# Patient Record
Sex: Female | Born: 1950 | Race: Black or African American | Hispanic: No | Marital: Married | Smoking: Former smoker
Health system: Southern US, Community
[De-identification: ages and names within clinical notes are randomized; demographics above are authoritative.]

## PROBLEM LIST (undated history)

## (undated) DIAGNOSIS — D649 Anemia, unspecified: Secondary | ICD-10-CM

## (undated) DIAGNOSIS — Z860100 Personal history of colon polyps, unspecified: Secondary | ICD-10-CM

## (undated) DIAGNOSIS — E119 Type 2 diabetes mellitus without complications: Secondary | ICD-10-CM

## (undated) DIAGNOSIS — I10 Essential (primary) hypertension: Secondary | ICD-10-CM

## (undated) DIAGNOSIS — K219 Gastro-esophageal reflux disease without esophagitis: Secondary | ICD-10-CM

## (undated) HISTORY — DX: Personal history of colon polyps, unspecified: Z86.0100

## (undated) HISTORY — DX: Personal history of colonic polyps: Z86.010

## (undated) HISTORY — PX: UPPER GASTROINTESTINAL ENDOSCOPY: SHX188

## (undated) HISTORY — PX: COLONOSCOPY: SHX174

## (undated) HISTORY — PX: HERNIA REPAIR: SHX51

## (undated) HISTORY — DX: Anemia, unspecified: D64.9

## (undated) HISTORY — DX: Gastro-esophageal reflux disease without esophagitis: K21.9

---

## 2016-07-18 MED ORDER — AZITHROMYCIN 250 MG PO TAB
ORAL_TABLET | Freq: Every day | 0 refills | Status: DC
Start: 2016-07-18 — End: 2017-01-21

## 2016-07-18 MED ORDER — FLUTICASONE 50 MCG/ACTUATION NA SPSN
1 | Freq: Two times a day (BID) | NASAL | 3 refills | 60.00000 days | Status: AC
Start: 2016-07-18 — End: ?

## 2016-07-18 MED ORDER — BENZONATATE 200 MG PO CAP
200 mg | ORAL_CAPSULE | ORAL | 0 refills | 9.00000 days | Status: DC | PRN
Start: 2016-07-18 — End: 2017-01-21

## 2016-12-18 ENCOUNTER — Encounter
Admit: 2016-12-18 | Discharge: 2016-12-18 | Payer: MEDICARE | Primary: Student in an Organized Health Care Education/Training Program

## 2016-12-18 NOTE — Telephone Encounter
Pt called stating BP meds not working. When she checks BP can go all the way up to 190/83. This was taken when she was a [facility] with one of their monitors. Asked pt to take again on home monitor while I was on the phone with her was 163/80.  Pt states starts she gets headaches then takes BP and its high. Pt asking advise should she come in for an appointment or just a BP check?  Celene Skeen, LPN

## 2016-12-19 NOTE — Telephone Encounter
Got pt apt on Friday 12/20/16 at 1pm with a NP. Called pt to confirm pt voiced understanding.   Celene Skeen, LPN

## 2017-01-06 ENCOUNTER — Encounter
Admit: 2017-01-06 | Discharge: 2017-01-06 | Payer: MEDICARE | Primary: Student in an Organized Health Care Education/Training Program

## 2017-01-06 DIAGNOSIS — I10 Essential (primary) hypertension: Principal | ICD-10-CM

## 2017-01-06 MED ORDER — LISINOPRIL 10 MG PO TAB
10 mg | ORAL_TABLET | Freq: Every day | ORAL | 0 refills | Status: AC
Start: 2017-01-06 — End: 2017-02-24

## 2017-01-06 MED ORDER — AMLODIPINE 5 MG PO TAB
5 mg | ORAL_TABLET | Freq: Every day | ORAL | 3 refills | Status: AC
Start: 2017-01-06 — End: 2017-02-24

## 2017-01-06 NOTE — Telephone Encounter
Medication(s) refilled per [facility] medication refill standing orders protocol. Ahni Bradwell, LPN

## 2017-01-08 ENCOUNTER — Encounter
Admit: 2017-01-08 | Discharge: 2017-01-08 | Payer: MEDICARE | Primary: Student in an Organized Health Care Education/Training Program

## 2017-01-08 NOTE — Telephone Encounter
Pt calling requesting follow up apt from hospital visit. I provided pt with scheduling number to scheduling apt at early convince .  Celene Skeen, LPN

## 2017-01-21 ENCOUNTER — Ambulatory Visit
Admit: 2017-01-21 | Discharge: 2017-01-21 | Payer: MEDICARE | Primary: Student in an Organized Health Care Education/Training Program

## 2017-01-21 ENCOUNTER — Encounter
Admit: 2017-01-21 | Discharge: 2017-01-21 | Payer: MEDICARE | Primary: Student in an Organized Health Care Education/Training Program

## 2017-01-21 DIAGNOSIS — E119 Type 2 diabetes mellitus without complications: ICD-10-CM

## 2017-01-21 DIAGNOSIS — D573 Sickle-cell trait: ICD-10-CM

## 2017-01-21 DIAGNOSIS — E876 Hypokalemia: Principal | ICD-10-CM

## 2017-01-21 DIAGNOSIS — I10 Essential (primary) hypertension: Principal | ICD-10-CM

## 2017-01-21 DIAGNOSIS — R3121 Asymptomatic microscopic hematuria: ICD-10-CM

## 2017-01-21 DIAGNOSIS — K219 Gastro-esophageal reflux disease without esophagitis: ICD-10-CM

## 2017-01-21 DIAGNOSIS — E7849 Other hyperlipidemia: ICD-10-CM

## 2017-01-21 LAB — CBC AND DIFF
Lab: 192 10*3/uL (ref 150–400)
Lab: 2 % — ABNORMAL LOW (ref >60)
Lab: 36 % (ref 24–44)
Lab: 4.8 M/UL — ABNORMAL HIGH (ref 4.0–5.0)
Lab: 53 % (ref 41–77)
Lab: 7.2 K/UL (ref 4.5–11.0)
Lab: 8 % (ref 4–12)
Lab: 9.1 FL (ref 7–11)

## 2017-01-21 LAB — URINALYSIS DIPSTICK REFLEX TO CULTURE
Lab: NEGATIVE 10*3/uL (ref 3–12)
Lab: NEGATIVE U/L — ABNORMAL LOW (ref 25–110)
Lab: POSITIVE mL/min — AB (ref 1.0–4.8)

## 2017-01-21 LAB — LIPID PROFILE
Lab: 114 mg/dL — ABNORMAL HIGH (ref <150)
Lab: 148 mg/dL (ref <200)
Lab: 23 mg/dL (ref 32.0–36.0)
Lab: 64 mg/dL — ABNORMAL LOW (ref <100)
Lab: 84 mg/dL — ABNORMAL HIGH (ref 11–15)

## 2017-01-21 LAB — URINALYSIS MICROSCOPIC REFLEX TO CULTURE

## 2017-01-21 LAB — COMPREHENSIVE METABOLIC PANEL
Lab: 139 MMOL/L (ref 137–147)
Lab: 3.6 MMOL/L (ref 3.5–5.1)

## 2017-01-21 LAB — HEMOGLOBIN A1C: Lab: 6.6 % — ABNORMAL HIGH (ref >40)

## 2017-01-21 MED ORDER — METFORMIN 1,000 MG PO TAB
1000 mg | ORAL_TABLET | Freq: Two times a day (BID) | ORAL | 3 refills | Status: AC
Start: 2017-01-21 — End: ?

## 2017-01-22 ENCOUNTER — Encounter
Admit: 2017-01-22 | Discharge: 2017-01-22 | Payer: MEDICARE | Primary: Student in an Organized Health Care Education/Training Program

## 2017-01-22 DIAGNOSIS — E119 Type 2 diabetes mellitus without complications: ICD-10-CM

## 2017-01-22 DIAGNOSIS — I10 Essential (primary) hypertension: Principal | ICD-10-CM

## 2017-01-22 NOTE — Progress Notes
I have reviewed the case with Dr. Batool at the time of the visit and agree with the evaluation and plan as documented by the resident.

## 2017-02-14 ENCOUNTER — Encounter
Admit: 2017-02-14 | Discharge: 2017-02-14 | Payer: MEDICARE | Primary: Student in an Organized Health Care Education/Training Program

## 2017-02-14 NOTE — Telephone Encounter
Pt calling seeing if we can fill out paperwork for a physical. Informed pt to drop off paperwork and we can take a look at it or she may need to schedule an appointment.  Celene Skeeniffany Elver Stadler, LPN

## 2017-02-24 ENCOUNTER — Encounter
Admit: 2017-02-24 | Discharge: 2017-02-24 | Payer: MEDICARE | Primary: Student in an Organized Health Care Education/Training Program

## 2017-02-24 DIAGNOSIS — I10 Essential (primary) hypertension: Principal | ICD-10-CM

## 2017-02-24 MED ORDER — AMLODIPINE 5 MG PO TAB
5 mg | ORAL_TABLET | Freq: Every day | ORAL | 1 refills | Status: AC
Start: 2017-02-24 — End: ?

## 2017-02-24 MED ORDER — PANTOPRAZOLE 20 MG PO TBEC
20 mg | ORAL_TABLET | Freq: Every day | ORAL | 1 refills | 90.00000 days | Status: AC
Start: 2017-02-24 — End: 2017-08-21

## 2017-02-24 MED ORDER — LOVASTATIN 20 MG PO TAB
20 mg | ORAL_TABLET | Freq: Every evening | ORAL | 1 refills | Status: AC
Start: 2017-02-24 — End: 2017-08-21

## 2017-02-24 MED ORDER — HYDROCHLOROTHIAZIDE 25 MG PO TAB
25 mg | ORAL_TABLET | Freq: Every morning | ORAL | 1 refills | 28.00000 days | Status: AC
Start: 2017-02-24 — End: 2017-08-21

## 2017-02-24 MED ORDER — LISINOPRIL 10 MG PO TAB
10 mg | ORAL_TABLET | Freq: Every day | ORAL | 1 refills | Status: AC
Start: 2017-02-24 — End: 2017-08-21

## 2017-02-24 NOTE — Telephone Encounter
Comprehensive Metabolic Profile    Lab Results   Component Value Date/Time    NA 139 01/21/2017 02:19 PM    K 3.6 01/21/2017 02:19 PM    CL 102 01/21/2017 02:19 PM    CO2 30 01/21/2017 02:19 PM    GAP 7 01/21/2017 02:19 PM    BUN 20 01/21/2017 02:19 PM    CR 1.09 (H) 01/21/2017 02:19 PM    GLU 104 (H) 01/21/2017 02:19 PM    Lab Results   Component Value Date/Time    CA 10.1 01/21/2017 02:19 PM    ALBUMIN 4.6 01/21/2017 02:19 PM    TOTPROT 7.9 01/21/2017 02:19 PM    ALKPHOS 85 01/21/2017 02:19 PM    AST 18 01/21/2017 02:19 PM    ALT 15 01/21/2017 02:19 PM    TOTBILI 0.4 01/21/2017 02:19 PM    GFR 50 (L) 01/21/2017 02:19 PM    GFRAA >60 01/21/2017 02:19 PM        Pt requesting refill.  LOV:01-21-17  Medication(s) ref[facility] per [facility]. Celene Skeeniffany Shanisha Lech, LPN

## 2017-04-23 ENCOUNTER — Ambulatory Visit
Admit: 2017-04-23 | Discharge: 2017-04-23 | Payer: MEDICARE | Primary: Student in an Organized Health Care Education/Training Program

## 2017-04-23 ENCOUNTER — Encounter
Admit: 2017-04-23 | Discharge: 2017-04-23 | Payer: MEDICARE | Primary: Student in an Organized Health Care Education/Training Program

## 2017-04-23 DIAGNOSIS — E119 Type 2 diabetes mellitus without complications: ICD-10-CM

## 2017-04-23 DIAGNOSIS — K219 Gastro-esophageal reflux disease without esophagitis: ICD-10-CM

## 2017-04-23 DIAGNOSIS — I10 Essential (primary) hypertension: Principal | ICD-10-CM

## 2017-04-23 DIAGNOSIS — R252 Cramp and spasm: ICD-10-CM

## 2017-04-23 DIAGNOSIS — Z0000 Encounter for general adult medical examination without abnormal findings: ICD-10-CM

## 2017-04-23 DIAGNOSIS — E7849 Other hyperlipidemia: ICD-10-CM

## 2017-06-19 ENCOUNTER — Encounter
Admit: 2017-06-19 | Discharge: 2017-06-19 | Payer: MEDICARE | Primary: Student in an Organized Health Care Education/Training Program

## 2017-08-20 ENCOUNTER — Encounter
Admit: 2017-08-20 | Discharge: 2017-08-20 | Payer: MEDICARE | Primary: Student in an Organized Health Care Education/Training Program

## 2017-08-20 DIAGNOSIS — I10 Essential (primary) hypertension: Principal | ICD-10-CM

## 2017-08-20 MED ORDER — PANTOPRAZOLE 20 MG PO TBEC
20 mg | ORAL_TABLET | Freq: Every day | ORAL | 3 refills | 90.00000 days | Status: AC
Start: 2017-08-20 — End: ?

## 2017-08-20 MED ORDER — HYDROCHLOROTHIAZIDE 25 MG PO TAB
25 mg | ORAL_TABLET | Freq: Every morning | ORAL | 3 refills | 28.00000 days | Status: AC
Start: 2017-08-20 — End: ?

## 2017-08-20 MED ORDER — LISINOPRIL 10 MG PO TAB
10 mg | ORAL_TABLET | Freq: Every day | ORAL | 3 refills | Status: AC
Start: 2017-08-20 — End: ?

## 2017-08-20 MED ORDER — LOVASTATIN 20 MG PO TAB
20 mg | ORAL_TABLET | Freq: Every evening | ORAL | 3 refills | Status: AC
Start: 2017-08-20 — End: ?

## 2017-12-26 ENCOUNTER — Encounter
Admit: 2017-12-26 | Discharge: 2017-12-26 | Payer: MEDICARE | Primary: Student in an Organized Health Care Education/Training Program

## 2018-05-18 ENCOUNTER — Encounter
Admit: 2018-05-18 | Discharge: 2018-05-18 | Payer: MEDICARE | Primary: Student in an Organized Health Care Education/Training Program

## 2018-09-01 ENCOUNTER — Encounter: Admit: 2018-09-01 | Discharge: 2018-09-01 | Primary: Student in an Organized Health Care Education/Training Program

## 2018-09-01 DIAGNOSIS — I10 Essential (primary) hypertension: Secondary | ICD-10-CM

## 2018-09-01 MED ORDER — LISINOPRIL 10 MG PO TAB
ORAL_TABLET | Freq: Every day | 8 refills
Start: 2018-09-01 — End: ?

## 2018-09-01 MED ORDER — PANTOPRAZOLE 20 MG PO TBEC
ORAL_TABLET | Freq: Every day | 5 refills
Start: 2018-09-01 — End: ?

## 2018-12-10 ENCOUNTER — Encounter
Admit: 2018-12-10 | Discharge: 2018-12-10 | Payer: MEDICARE | Primary: Student in an Organized Health Care Education/Training Program

## 2020-05-11 DIAGNOSIS — D649 Anemia, unspecified: Secondary | ICD-10-CM | POA: Diagnosis not present

## 2020-05-11 DIAGNOSIS — R946 Abnormal results of thyroid function studies: Secondary | ICD-10-CM | POA: Diagnosis not present

## 2020-05-11 DIAGNOSIS — K219 Gastro-esophageal reflux disease without esophagitis: Secondary | ICD-10-CM | POA: Diagnosis not present

## 2020-05-11 DIAGNOSIS — I10 Essential (primary) hypertension: Secondary | ICD-10-CM | POA: Diagnosis not present

## 2020-05-11 DIAGNOSIS — R7303 Prediabetes: Secondary | ICD-10-CM | POA: Diagnosis not present

## 2020-05-11 DIAGNOSIS — E7800 Pure hypercholesterolemia, unspecified: Secondary | ICD-10-CM | POA: Diagnosis not present

## 2020-07-28 ENCOUNTER — Other Ambulatory Visit: Payer: Self-pay

## 2020-07-28 ENCOUNTER — Encounter (HOSPITAL_COMMUNITY): Payer: Self-pay | Admitting: Emergency Medicine

## 2020-07-28 ENCOUNTER — Ambulatory Visit (HOSPITAL_COMMUNITY)
Admission: EM | Admit: 2020-07-28 | Discharge: 2020-07-28 | Disposition: A | Discharge status: Home / Self Care | Payer: Medicare HMO | Attending: Family Medicine | Admitting: Family Medicine

## 2020-07-28 DIAGNOSIS — J029 Acute pharyngitis, unspecified: Secondary | ICD-10-CM | POA: Diagnosis not present

## 2020-07-28 DIAGNOSIS — Z79899 Other long term (current) drug therapy: Secondary | ICD-10-CM | POA: Diagnosis not present

## 2020-07-28 DIAGNOSIS — J069 Acute upper respiratory infection, unspecified: Secondary | ICD-10-CM | POA: Diagnosis not present

## 2020-07-28 DIAGNOSIS — J302 Other seasonal allergic rhinitis: Secondary | ICD-10-CM | POA: Insufficient documentation

## 2020-07-28 DIAGNOSIS — Z20822 Contact with and (suspected) exposure to covid-19: Secondary | ICD-10-CM | POA: Diagnosis not present

## 2020-07-28 HISTORY — DX: Essential (primary) hypertension: I10

## 2020-07-28 HISTORY — DX: Type 2 diabetes mellitus without complications: E11.9

## 2020-07-28 MED ORDER — PROMETHAZINE-DM 6.25-15 MG/5ML PO SYRP: 5.0000 mL | ORAL_SOLUTION | Freq: Four times a day (QID) | ORAL | 0 refills | Status: DC | PRN

## 2020-07-28 MED ORDER — CETIRIZINE HCL 10 MG PO TABS: 10.0000 mg | ORAL_TABLET | Freq: Every day | ORAL | 2 refills | Status: DC

## 2020-07-28 NOTE — ED Triage Notes (Signed)
Pt presents with sough and sore throat xs 3 day.

## 2020-07-28 NOTE — ED Provider Notes (Signed)
[facility]    CSN: [id number] Arrival date & time: 07/28/20  1612      History   Chief Complaint Chief Complaint  Patient presents with  . Sore Throat  . Cough    HPI Shelley Bryant is a 70 y.o. female.   Patient here today with 3-day history of cough, sore throat, congestion.  The cough is at times productive.  Denies fever, chills, chest pain, shortness of breath, abdominal pain, nausea vomiting diarrhea.  No known sick contacts.  Taking throat lozenges so far with minimal relief.  Does have a history of seasonal allergies but she does not take anything for this.     Past Medical History:  Diagnosis Date  . Diabetes mellitus without complication ([place])   . Hypertension     There are no problems to display for this patient.   Past Surgical History:  Procedure Laterality Date  . HERNIA REPAIR      OB History   No obstetric history on file.      Home Medications    Prior to Admission medications   Medication Sig Start Date End Date Taking? Authorizing Provider  amLODipine (NORVASC) 5 MG tablet 1 tablet 02/24/17  Yes [provider]  cetirizine (ZYRTEC ALLERGY) 10 MG tablet Take 1 tablet (10 mg total) by mouth daily. 07/28/20  Yes Volney American, PA-C  lisinopril (ZESTRIL) 40 MG tablet 1 tablet 07/01/18  Yes [provider]  lovastatin (MEVACOR) 20 MG tablet 1 tablet with the evening meal 08/20/17  Yes [provider]  metFORMIN (GLUCOPHAGE) 500 MG tablet 1 tablet with a meal 01/03/20  Yes [provider]  pantoprazole (PROTONIX) 20 MG tablet 1 tablet 08/20/17  Yes [provider]  promethazine-dextromethorphan (PROMETHAZINE-DM) 6.25-15 MG/5ML syrup Take 5 mLs by mouth 4 (four) times daily as needed for cough. 07/28/20  Yes Volney American, PA-C  hydrochlorothiazide (HYDRODIURIL) 25 MG tablet Take 1 tablet by mouth daily. 03/29/20   [provider]    Family History Family History   Problem Relation Age of Onset  . Heart disease Mother   . Cirrhosis Father     Social History Social History   Tobacco Use  . Smoking status: Never Smoker  . Smokeless tobacco: Never Used  Substance Use Topics  . Alcohol use: Yes  . Drug use: Never     Allergies   Patient has no known allergies.   Review of Systems Review of Systems HPI  Physical Exam Triage Vital Signs ED Triage Vitals  Enc Vitals Group     BP 07/28/20 1713 136/78     Pulse Rate 07/28/20 1713 (!) 53     Resp 07/28/20 1713 17     Temp 07/28/20 1713 98.3 F (36.8 C)     Temp Source 07/28/20 1713 Oral     SpO2 07/28/20 1713 96 %     Weight --      Height --      Head Circumference --      Peak Flow --      Pain Score 07/28/20 1710 0     Pain Loc --      Pain Edu? --      Excl. in [place]? --    No data found.  Updated Vital Signs BP 136/78 (BP Location: Right Arm)   Pulse (!) 53   Temp 98.3 F (36.8 C) (Oral)   Resp 17   SpO2 96%   Visual Acuity Right  Eye Distance:   Left Eye Distance:   Bilateral Distance:    Right Eye Near:   Left Eye Near:    Bilateral Near:     Physical Exam Vitals and nursing note reviewed.  Constitutional:      Appearance: Normal appearance. She is not ill-appearing.  HENT:     Head: Atraumatic.     Right Ear: Tympanic membrane normal.     Left Ear: Tympanic membrane normal.     Nose: Rhinorrhea present.     Mouth/Throat:     Mouth: Mucous membranes are moist.     Pharynx: Oropharynx is clear. Posterior oropharyngeal erythema present.  Eyes:     Extraocular Movements: Extraocular movements intact.     Conjunctiva/sclera: Conjunctivae normal.  Cardiovascular:     Rate and Rhythm: Normal rate and regular rhythm.     Heart sounds: Normal heart sounds.  Pulmonary:     Effort: Pulmonary effort is normal. No respiratory distress.     Breath sounds: Normal breath sounds. No wheezing or rales.  Musculoskeletal:        General: Normal range of motion.      Cervical back: Normal range of motion and neck supple.  Skin:    General: Skin is warm and dry.  Neurological:     Mental Status: She is alert and oriented to person, place, and time.  Psychiatric:        Mood and Affect: Mood normal.        Thought Content: Thought content normal.        Judgment: Judgment normal.      UC Treatments / Results  Labs (all labs ordered are listed, but only abnormal results are displayed) Labs Reviewed  SARS CORONAVIRUS 2 (TAT 6-24 HRS)    EKG   Radiology No results found.  Procedures Procedures (including critical care time)  Medications Ordered in UC Medications - No data to display  Initial Impression / Assessment and Plan / UC Course  I have reviewed the triage vital signs and the nursing notes.  Pertinent labs & imaging results that were available during my care of the patient were reviewed by me and considered in my medical decision making (see chart for details).     Viral versus allergic.  Given her history of seasonal allergies, sent Zyrtec for her to start taking daily to cover for allergic symptoms.  COVID PCR pending, discussed safe over-the-counter medications for symptomatic management and supportive home care.  Isolation protocol reviewed, return cautions given.  Final Clinical Impressions(s) / UC Diagnoses   Final diagnoses:  Viral URI with cough  Seasonal allergies   Discharge Instructions   None    ED Prescriptions    Medication Sig Dispense Auth. Provider   promethazine-dextromethorphan (PROMETHAZINE-DM) 6.25-15 MG/5ML syrup Take 5 mLs by mouth 4 (four) times daily as needed for cough. 100 mL Volney American, PA-C   cetirizine (ZYRTEC ALLERGY) 10 MG tablet Take 1 tablet (10 mg total) by mouth daily. 30 tablet Volney American, [place]     PDMP not reviewed this encounter.   Volney American, [place] 07/28/20 1740

## 2020-07-29 LAB — SARS CORONAVIRUS 2 (TAT 6-24 HRS): SARS Coronavirus 2: NEGATIVE

## 2020-09-20 DIAGNOSIS — E119 Type 2 diabetes mellitus without complications: Secondary | ICD-10-CM | POA: Diagnosis not present

## 2020-10-22 ENCOUNTER — Other Ambulatory Visit: Payer: Self-pay | Admitting: Family Medicine

## 2020-10-22 NOTE — Telephone Encounter (Signed)
Pt no longer under prescriber care

## 2020-11-05 ENCOUNTER — Encounter (HOSPITAL_COMMUNITY): Payer: Self-pay

## 2020-11-05 ENCOUNTER — Emergency Department (HOSPITAL_COMMUNITY): Payer: 59

## 2020-11-05 ENCOUNTER — Other Ambulatory Visit: Payer: Self-pay

## 2020-11-05 ENCOUNTER — Emergency Department (HOSPITAL_COMMUNITY)
Admission: EM | Admit: 2020-11-05 | Discharge: 2020-11-05 | Disposition: A | Discharge status: Home / Self Care | Payer: 59 | Attending: Emergency Medicine | Admitting: Emergency Medicine

## 2020-11-05 DIAGNOSIS — R0602 Shortness of breath: Secondary | ICD-10-CM | POA: Insufficient documentation

## 2020-11-05 DIAGNOSIS — R519 Headache, unspecified: Secondary | ICD-10-CM | POA: Insufficient documentation

## 2020-11-05 DIAGNOSIS — Z5321 Procedure and treatment not carried out due to patient leaving prior to being seen by health care provider: Secondary | ICD-10-CM | POA: Diagnosis not present

## 2020-11-05 LAB — COMPREHENSIVE METABOLIC PANEL
ALT: 18 U/L (ref 0–44)
AST: 21 U/L (ref 15–41)
Albumin: 3.7 g/dL (ref 3.5–5.0)
Alkaline Phosphatase: 65 U/L (ref 38–126)
Anion gap: 7 (ref 5–15)
BUN: 13 mg/dL (ref 8–23)
CO2: 25 mmol/L (ref 22–32)
Calcium: 9.4 mg/dL (ref 8.9–10.3)
Chloride: 107 mmol/L (ref 98–111)
Creatinine, Ser: 1.06 mg/dL — ABNORMAL HIGH (ref 0.44–1.00)
GFR, Estimated: 57 mL/min — ABNORMAL LOW (ref >60)
Glucose, Bld: 128 mg/dL — ABNORMAL HIGH (ref 70–99)
Potassium: 3.7 mmol/L (ref 3.5–5.1)
Sodium: 139 mmol/L (ref 135–145)
Total Bilirubin: 0.4 mg/dL (ref 0.3–1.2)
Total Protein: 6.6 g/dL (ref 6.5–8.1)

## 2020-11-05 LAB — CBC WITH DIFFERENTIAL/PLATELET
Abs Immature Granulocytes: 0.02 10*3/uL (ref 0.00–0.07)
Basophils Absolute: 0 10*3/uL (ref 0.0–0.1)
Basophils Relative: 1 %
Eosinophils Absolute: 0.1 10*3/uL (ref 0.0–0.5)
Eosinophils Relative: 2 %
HCT: 32.8 % — ABNORMAL LOW (ref 36.0–46.0)
Hemoglobin: 11 g/dL — ABNORMAL LOW (ref 12.0–15.0)
Immature Granulocytes: 0 %
Lymphocytes Relative: 32 %
Lymphs Abs: 1.7 10*3/uL (ref 0.7–4.0)
MCH: 25.5 pg — ABNORMAL LOW (ref 26.0–34.0)
MCHC: 33.5 g/dL (ref 30.0–36.0)
MCV: 76.1 fL — ABNORMAL LOW (ref 80.0–100.0)
Monocytes Absolute: 0.4 10*3/uL (ref 0.1–1.0)
Monocytes Relative: 7 %
Neutro Abs: 3.1 10*3/uL (ref 1.7–7.7)
Neutrophils Relative %: 58 %
Platelets: 183 10*3/uL (ref 150–400)
RBC: 4.31 MIL/uL (ref 3.87–5.11)
RDW: 14.8 % (ref 11.5–15.5)
WBC: 5.3 10*3/uL (ref 4.0–10.5)
nRBC: 0 % (ref 0.0–0.2)

## 2020-11-05 LAB — LIPASE, BLOOD: Lipase: 36 U/L (ref 11–51)

## 2020-11-05 LAB — MAGNESIUM: Magnesium: 2 mg/dL (ref 1.7–2.4)

## 2020-11-05 LAB — TROPONIN I (HIGH SENSITIVITY)
Troponin I (High Sensitivity): 4 ng/L (ref <18)
Troponin I (High Sensitivity): 4 ng/L (ref <18)

## 2020-11-05 LAB — BRAIN NATRIURETIC PEPTIDE: B Natriuretic Peptide: 30.6 pg/mL (ref 0.0–100.0)

## 2020-11-05 NOTE — ED Notes (Signed)
Pt stated was not waiting any longer.

## 2020-11-05 NOTE — ED Provider Notes (Signed)
Emergency Medicine Provider Triage Evaluation Note  Shelley Bryant , a 70 y.o. female  was evaluated in triage.  Pt complains of multiple complaints. Patient with epigastric pain, feels like needs to burp. Also with Chest pain, central, non radiating. No back pain, left arm, jaw pain. Non exertional in nature. Having heart palpitations. Feels like BLE swelling at the feet. No hx of HF. Occasional cough. No PND. No sick contacts. Feels like throat swells when she eats. No throat swelling currently  Review of Systems  Positive: HA, cough, CP, SOB, nausea Negative: Back pain, fever, emesis, dysuria, diarrhea  Physical Exam  BP (!) 151/84 (BP Location: Right Arm)   Pulse 68   Temp 99.2 F (37.3 C) (Oral)   Resp 18   Ht '5\' 7"'$  (1.702 m)   Wt 103 kg   SpO2 100%   BMI 35.55 kg/m  Gen:   Awake, no distress   Cardiac: Clear Resp:  Normal effort  MSK:   Moves extremities without difficulty  Abd:  Soft Neuro:  Cn 2-12 intact. Equal strength Other:    Medical Decision Making  Medically screening exam initiated at 4:11 PM.  Appropriate orders placed.  BAO MOOREFIELD was informed that the remainder of the evaluation will be completed by another provider, this initial triage assessment does not replace that evaluation, and the importance of remaining in the ED until their evaluation is complete.  CP, HA, SOB, epigastric pain  Work up started.   Nettie Elm, PA-C 11/05/20 1614    Wyvonnia Dusky, MD 11/06/20 [phone]

## 2020-11-05 NOTE — ED Triage Notes (Signed)
Patient arrives with complaints of shortness of breath x2 days, feeling like her heart is fluttering, headaches, swollen glands in her neck.

## 2020-11-14 DIAGNOSIS — R946 Abnormal results of thyroid function studies: Secondary | ICD-10-CM | POA: Diagnosis not present

## 2020-11-14 DIAGNOSIS — I10 Essential (primary) hypertension: Secondary | ICD-10-CM | POA: Diagnosis not present

## 2020-11-14 DIAGNOSIS — E7800 Pure hypercholesterolemia, unspecified: Secondary | ICD-10-CM | POA: Diagnosis not present

## 2020-11-14 DIAGNOSIS — R7303 Prediabetes: Secondary | ICD-10-CM | POA: Diagnosis not present

## 2020-12-12 DIAGNOSIS — J0110 Acute frontal sinusitis, unspecified: Secondary | ICD-10-CM | POA: Diagnosis not present

## 2020-12-12 DIAGNOSIS — R0981 Nasal congestion: Secondary | ICD-10-CM | POA: Diagnosis not present

## 2020-12-12 DIAGNOSIS — I10 Essential (primary) hypertension: Secondary | ICD-10-CM | POA: Diagnosis not present

## 2020-12-12 DIAGNOSIS — R599 Enlarged lymph nodes, unspecified: Secondary | ICD-10-CM | POA: Diagnosis not present

## 2020-12-28 DIAGNOSIS — R946 Abnormal results of thyroid function studies: Secondary | ICD-10-CM | POA: Diagnosis not present

## 2020-12-28 DIAGNOSIS — E7800 Pure hypercholesterolemia, unspecified: Secondary | ICD-10-CM | POA: Diagnosis not present

## 2020-12-28 DIAGNOSIS — R7303 Prediabetes: Secondary | ICD-10-CM | POA: Diagnosis not present

## 2020-12-28 DIAGNOSIS — Z23 Encounter for immunization: Secondary | ICD-10-CM | POA: Diagnosis not present

## 2020-12-28 DIAGNOSIS — I10 Essential (primary) hypertension: Secondary | ICD-10-CM | POA: Diagnosis not present

## 2020-12-28 DIAGNOSIS — K219 Gastro-esophageal reflux disease without esophagitis: Secondary | ICD-10-CM | POA: Diagnosis not present

## 2021-01-04 ENCOUNTER — Encounter: Payer: Self-pay | Admitting: Gastroenterology

## 2021-01-16 DIAGNOSIS — Z206 Contact with and (suspected) exposure to human immunodeficiency virus [HIV]: Secondary | ICD-10-CM | POA: Diagnosis not present

## 2021-01-16 DIAGNOSIS — Z118 Encounter for screening for other infectious and parasitic diseases: Secondary | ICD-10-CM | POA: Diagnosis not present

## 2021-01-16 DIAGNOSIS — Z113 Encounter for screening for infections with a predominantly sexual mode of transmission: Secondary | ICD-10-CM | POA: Diagnosis not present

## 2021-01-17 ENCOUNTER — Other Ambulatory Visit (INDEPENDENT_AMBULATORY_CARE_PROVIDER_SITE_OTHER): Payer: Medicare HMO

## 2021-01-17 ENCOUNTER — Ambulatory Visit (INDEPENDENT_AMBULATORY_CARE_PROVIDER_SITE_OTHER): Payer: Medicare HMO | Admitting: Gastroenterology

## 2021-01-17 ENCOUNTER — Encounter: Payer: Self-pay | Admitting: Gastroenterology

## 2021-01-17 VITALS — BP 126/68 | HR 75 | Ht 67.0 in | Wt 226.6 lb

## 2021-01-17 DIAGNOSIS — R079 Chest pain, unspecified: Secondary | ICD-10-CM | POA: Diagnosis not present

## 2021-01-17 DIAGNOSIS — D508 Other iron deficiency anemias: Secondary | ICD-10-CM

## 2021-01-17 DIAGNOSIS — K219 Gastro-esophageal reflux disease without esophagitis: Secondary | ICD-10-CM

## 2021-01-17 DIAGNOSIS — R0602 Shortness of breath: Secondary | ICD-10-CM

## 2021-01-17 DIAGNOSIS — R1310 Dysphagia, unspecified: Secondary | ICD-10-CM

## 2021-01-17 DIAGNOSIS — R002 Palpitations: Secondary | ICD-10-CM

## 2021-01-17 LAB — BASIC METABOLIC PANEL
BUN: 15 mg/dL (ref 6–23)
CO2: 29 mEq/L (ref 19–32)
Calcium: 9.9 mg/dL (ref 8.4–10.5)
Chloride: 103 mEq/L (ref 96–112)
Creatinine, Ser: 1.29 mg/dL — ABNORMAL HIGH (ref 0.40–1.20)
GFR: 41.94 mL/min — ABNORMAL LOW (ref >60.00)
Glucose, Bld: 136 mg/dL — ABNORMAL HIGH (ref 70–99)
Potassium: 3.8 mEq/L (ref 3.5–5.1)
Sodium: 139 mEq/L (ref 135–145)

## 2021-01-17 LAB — CBC WITH DIFFERENTIAL/PLATELET
Basophils Absolute: 0 10*3/uL (ref 0.0–0.1)
Basophils Relative: 0.9 % (ref 0.0–3.0)
Eosinophils Absolute: 0.1 10*3/uL (ref 0.0–0.7)
Eosinophils Relative: 2.2 % (ref 0.0–5.0)
HCT: 36.8 % (ref 36.0–46.0)
Hemoglobin: 12.6 g/dL (ref 12.0–15.0)
Lymphocytes Relative: 30.1 % (ref 12.0–46.0)
Lymphs Abs: 1.7 10*3/uL (ref 0.7–4.0)
MCHC: 34.3 g/dL (ref 30.0–36.0)
MCV: 74.6 fl — ABNORMAL LOW (ref 78.0–100.0)
Monocytes Absolute: 0.4 10*3/uL (ref 0.1–1.0)
Monocytes Relative: 7.2 % (ref 3.0–12.0)
Neutro Abs: 3.4 10*3/uL (ref 1.4–7.7)
Neutrophils Relative %: 59.6 % (ref 43.0–77.0)
Platelets: 199 10*3/uL (ref 150.0–400.0)
RBC: 4.93 Mil/uL (ref 3.87–5.11)
RDW: 15.9 % — ABNORMAL HIGH (ref 11.5–15.5)
WBC: 5.7 10*3/uL (ref 4.0–10.5)

## 2021-01-17 LAB — IBC + FERRITIN
Ferritin: 225.8 ng/mL (ref 10.0–291.0)
Iron: 48 ug/dL (ref 42–145)
Saturation Ratios: 14.6 % — ABNORMAL LOW (ref 20.0–50.0)
TIBC: 329 ug/dL (ref 250.0–450.0)
Transferrin: 235 mg/dL (ref 212.0–360.0)

## 2021-01-17 LAB — VITAMIN B12: Vitamin B-12: 811 pg/mL (ref 211–911)

## 2021-01-17 LAB — FOLATE: Folate: 14.8 ng/mL (ref >5.9)

## 2021-01-17 NOTE — Patient Instructions (Addendum)
Your provider has requested that you go to the basement level for lab work before leaving today. Press "B" on the elevator. The lab is located at the first door on the left as you exit the elevator.   You have been scheduled for an endoscopy and colonoscopy. Please follow the written instructions given to you at your visit today. Please pick up your prep supplies at the pharmacy within the next 1-3 days. If you use inhalers (even only as needed), please bring them with you on the day of your procedure.   STOP taking Protonix  We will send Lansoprazole to your pharmacy, you will take that twice a day before breakfast and dinner   We will refer you to cardiology and contact you with that appointment or they may call you directly to schedule   Due to recent changes in healthcare laws, you may see the results of your imaging and laboratory studies on MyChart before your provider has had a chance to review them.  We understand that in some cases there may be results that are confusing or concerning to you. Not all laboratory results come back in the same time frame and the provider may be waiting for multiple results in order to interpret others.  Please give [place] 48 hours in order for your provider to thoroughly review all the results before contacting the office for clarification of your results.    If you are age 63 or older, your body mass index should be between 23-30. Your Body mass index is 35.49 kg/m. If this is out of the aforementioned range listed, please consider follow up with your Primary Care Provider.  If you are age 27 or younger, your body mass index should be between 19-25. Your Body mass index is 35.49 kg/m. If this is out of the aformentioned range listed, please consider follow up with your Primary Care Provider.   ________________________________________________________  The [facility] providers would like to encourage you to use [facility] to communicate with providers for  non-urgent requests or questions.  Due to long hold times on the telephone, sending your provider a message by [facility] may be a faster and more efficient way to get a response.  Please allow 48 business hours for a response.  Please remember that this is for non-urgent requests.  _______________________________________________________    I appreciate the  opportunity to care for you  Thank You   Harl Bowie , MD

## 2021-01-17 NOTE — Progress Notes (Signed)
Shelley Bryant    [id number]    November 18, 1950  Primary Care Physician:Collins, Hinton Dyer, DO  Referring Physician: Janie Morning, [address] [place],  [place] [address]   Chief complaint:  GERD, Bloated  HPI:  70 year old very pleasant female here for new patient with complaints of difficulty swallowing, epigastric pain and globus sensation. She feels occasionally her throat feels swollen and she is choking when she tries to swallow anything.  She also has intermittent increased mucus in her throat.  She has been experiencing intermittent chest pain, flutter/palpitations in her chest and also shortness of breath.  She had ankle swelling in both her legs few weeks ago but it has improved in the past few days.  Denies any vomiting, rectal bleeding, blood in stool or melena. No family history of GI malignancy.  Last colonoscopy was about 7 years ago in [place], report is not available during this visit to review  She was recently seen in the ER with complaints of epigastric pain, chest pain, shortness of breath, heart palpitations and lower extremity swelling.  Patient left ER after waiting for long period before she could be completely evaluated in August 2022  Chest x-ray November 05, 2020: No evidence of pneumonia or pulmonary edema EKG October 28, 2020: Normal sinus rhythm, no acute abnormality On review of labs, had normal troponin and BNP.  Mild anemia with hemoglobin 11, low MCV  Outpatient Encounter Medications as of 01/17/2021  Medication Sig   amLODipine (NORVASC) 5 MG tablet 1 tablet   cetirizine (ZYRTEC ALLERGY) 10 MG tablet Take 1 tablet (10 mg total) by mouth daily.   hydrochlorothiazide (HYDRODIURIL) 25 MG tablet Take 1 tablet by mouth daily.   lisinopril (ZESTRIL) 40 MG tablet 1 tablet   lovastatin (MEVACOR) 20 MG tablet 1 tablet with the evening meal   metFORMIN (GLUCOPHAGE) 500 MG tablet 1 tablet with a meal   pantoprazole (PROTONIX) 20  MG tablet 1 tablet   promethazine-dextromethorphan (PROMETHAZINE-DM) 6.25-15 MG/5ML syrup Take 5 mLs by mouth 4 (four) times daily as needed for cough.   No facility-administered encounter medications on file as of 01/17/2021.    Allergies as of 01/17/2021   (No Known Allergies)    Past Medical History:  Diagnosis Date   Diabetes mellitus without complication ([place])    Hypertension     Past Surgical History:  Procedure Laterality Date   HERNIA REPAIR      Family History  Problem Relation Age of Onset   Heart disease Mother    Cirrhosis Father     Social History   Socioeconomic History   Marital status: Married    Spouse name: Not on file   Number of children: Not on file   Years of education: Not on file   Highest education level: Not on file  Occupational History   Not on file  Tobacco Use   Smoking status: Never   Smokeless tobacco: Never  Substance and Sexual Activity   Alcohol use: Yes   Drug use: Never   Sexual activity: Not on file  Other Topics Concern   Not on file  Social History Narrative   Not on file   Social Determinants of Health   Financial Resource Strain: Not on file  Food Insecurity: Not on file  Transportation Needs: Not on file  Physical Activity: Not on file  Stress: Not on file  Social Connections: Not on file  Intimate Partner  Violence: Not on file      Review of systems: All other review of systems negative except as mentioned in the HPI.   Physical Exam: Vitals:   01/17/21 1052  BP: 126/68  Pulse: 75  SpO2: 98%   Body mass index is 35.49 kg/m. Gen:      No acute distress HEENT:  sclera anicteric Abd:      soft, non-tender; no palpable masses, no distension Ext:    No edema Neuro: alert and oriented x 3 Psych: normal mood and affect  Data Reviewed:  Reviewed labs, radiology imaging, old records and pertinent past GI work up   Assessment and Plan/Recommendations:  70 year old very pleasant female with  complaints of worsening GERD symptoms, globus sensation, dysphagia and epigastric abdominal pain On review of labs she has microcytic anemia, will follow-up CBC, CMP, iron panel, B12 and folate to further evaluate the anemia Patient will benefit from EGD and colonoscopy for further evaluation but prior to proceeding with invasive procedures and anesthesia, will request cardiology clearance given recent onset symptoms of chest pain, palpitations, shortness of breath and lower extremity edema to exclude CHF/angina  GERD: Use lansoprazole 30 mg twice daily, 30 minutes before breakfast and supper Discontinue pantoprazole Discussed antireflux measures and lifestyle modifications  Plan for EGD and colonoscopy after cardiology clearance We will send on urgent referral to cardiology The risks and benefits as well as alternatives of endoscopic procedure(s) have been discussed and reviewed. All questions answered. The patient agrees to proceed.  Return in 3 months    The patient was provided an opportunity to ask questions and all were answered. The patient agreed with the plan and demonstrated an understanding of the instructions.  Shelley Bryant , MD    CC: Janie Morning, DO

## 2021-01-23 ENCOUNTER — Ambulatory Visit (INDEPENDENT_AMBULATORY_CARE_PROVIDER_SITE_OTHER): Payer: Medicare HMO | Admitting: Internal Medicine

## 2021-01-23 ENCOUNTER — Other Ambulatory Visit: Payer: Self-pay

## 2021-01-23 ENCOUNTER — Encounter: Payer: Self-pay | Admitting: Internal Medicine

## 2021-01-23 VITALS — BP 130/84 | HR 75 | Ht 67.0 in | Wt 226.4 lb

## 2021-01-23 DIAGNOSIS — E109 Type 1 diabetes mellitus without complications: Secondary | ICD-10-CM

## 2021-01-23 DIAGNOSIS — R079 Chest pain, unspecified: Secondary | ICD-10-CM

## 2021-01-23 NOTE — Patient Instructions (Addendum)
Medication Instructions:  NO CHANGES *If you need a refill on your cardiac medications before your next appointment, please call your pharmacy*   Lab Work: TODAY LIPID AND HGB A1C If you have labs (blood work) drawn today and your tests are completely normal, you will receive your results only by: [name] (if you have MyChart) OR A paper copy in the mail If you have any lab test that is abnormal or we need to change your treatment, we will call you to review the results.   Testing/Procedures: HOME SLEEP STUDY   Follow-Up: At [facility], you and your health needs are our priority.  As part of our continuing mission to provide you with exceptional heart care, we have created designated Provider Care Teams.  These Care Teams include your primary Cardiologist (physician) and Advanced Practice Providers (APPs -  Physician Assistants and Nurse Practitioners) who all work together to provide you with the care you need, when you need it.  We recommend signing up for the patient portal called "MyChart".  Sign up information is provided on this After Visit Summary.  MyChart is used to connect with patients for Virtual Visits (Telemedicine).  Patients are able to view lab/test results, encounter notes, upcoming appointments, etc.  Non-urgent messages can be sent to your provider as well.   To learn more about what you can do with MyChart, go to [email or web address].    Your next appointment:   MAY WITH DR ROSS  The format for your next appointment:     Provider:       Other Instructions NONE

## 2021-01-23 NOTE — Progress Notes (Signed)
Cardiology Office Note   Date:  01/23/2021   ID:  Camden, Mazzaferro 14-Jun-1950, MRN [id number]  PCP:  Janie Morning, DO  Cardiologist:   Dorris Carnes, MD   Patient presents for follow-up of chest pain.    History of Present Illness: Shelley Bryant is a 70 y.o. female with a history of  DM, HTN, HL, GERD and CP  chest pain   She was seen in [facility] ED in Aug 2022  Had multple complaints     Talking to the patient says she says she has a fluttering sensation in her chest.  Like butterflies.  Last week was the first time.  Last seconds.  No dizziness.  She also has tightness in her chest.  Feels like acid reflux.  Indeed it might be that.  Sometimes she will take baking soda and will help.  When she gets up in the morning sometimes she will have it after drinking tea.  Not associated with activity.  Occasional association with fluttering this past week.  The patient does say she wakes up  fatigued  Husband thinks she has sleep apnea       Current Meds  Medication Sig   amLODipine (NORVASC) 5 MG tablet 1 tablet   hydrochlorothiazide (HYDRODIURIL) 25 MG tablet Take 1 tablet by mouth daily.   lovastatin (MEVACOR) 20 MG tablet 1 tablet with the evening meal   metFORMIN (GLUCOPHAGE) 500 MG tablet 1 tablet with a meal   pantoprazole (PROTONIX) 20 MG tablet 1 tablet     Allergies:   Patient has no known allergies.   Past Medical History:  Diagnosis Date   Anemia    Diabetes mellitus without complication ([facility])    GERD (gastroesophageal reflux disease)    Hx of colonic polyps    Hypertension     Past Surgical History:  Procedure Laterality Date   HERNIA REPAIR       Social History:  The patient  reports that she has never smoked. She has never used smokeless tobacco. She reports current alcohol use. She reports that she does not use drugs.   Family History:  The patient's family history includes Cirrhosis in her father; Heart disease in her mother.    ROS:  Please  see the history of present illness. All other systems are reviewed and  Negative to the above problem except as noted.    PHYSICAL EXAM: VS:  BP 130/84   Pulse 75   Ht 5\' 7"  (1.702 m)   Wt 226 lb 6.4 oz (102.7 kg)   SpO2 98%   BMI 35.46 kg/m   GEN: Obese 70 yo  in no acute distress  HEENT: normal  Neck: no JVD, carotid bruits   Cardiac: RRR; no murmurs, rubs  No LE  edema  Respiratory:  clear to auscultation bilaterally, n GI: soft, nontender, nondistended, + BS  No hepatomegaly  MS: no deformity Moving all extremities   Skin: warm and dry, no rash Neuro:  Strength and sensation are intact Psych: euthymic mood, full affect   EKG:  EKG is ordered today.SR   75 bpm   Nonspecific ST changes    Lipid Panel No results found for: CHOL, TRIG, HDL, CHOLHDL, VLDL, LDLCALC, LDLDIRECT    Wt Readings from Last 3 Encounters:  01/23/21 226 lb 6.4 oz (102.7 kg)  01/17/21 226 lb 9.6 oz (102.8 kg)  11/05/20 227 lb (103 kg)      ASSESSMENT AND PLAN:  1Chest  pain   Atypical  SOunds more like like GI pain   She is due to have endosopy in Jan.  I would defer any further work-up until this is done.  2  HTN  Good control  3  palpitations   Brief\.  Just started.  Do not sound hemodynamically destabilizing.  Follow  4  HL  Will get lipids today   5  DM  A1C today  6  ? Sleep apnea  has symptoms   WIll get home sleep study.     Current medicines are reviewed at length with the patient today.  The patient does not have concerns regarding medicines.  Signed, Dorris Carnes, MD  01/23/2021 1:52 PM    [facility] [place], [place], [place]  [address] Phone: [phone]; Fax: [phone]

## 2021-01-24 LAB — LIPID PANEL
Chol/HDL Ratio: 3.1 ratio (ref 0.0–4.4)
Cholesterol, Total: 180 mg/dL (ref 100–199)
HDL: 59 mg/dL (ref >39)
LDL Chol Calc (NIH): 99 mg/dL (ref 0–99)
Triglycerides: 123 mg/dL (ref 0–149)
VLDL Cholesterol Cal: 22 mg/dL (ref 5–40)

## 2021-01-24 LAB — HEMOGLOBIN A1C
Est. average glucose Bld gHb Est-mCnc: 148 mg/dL
Hgb A1c MFr Bld: 6.8 % — ABNORMAL HIGH (ref 4.8–5.6)

## 2021-01-29 ENCOUNTER — Telehealth: Payer: Self-pay

## 2021-01-29 ENCOUNTER — Telehealth: Payer: Self-pay | Admitting: Internal Medicine

## 2021-01-29 DIAGNOSIS — E785 Hyperlipidemia, unspecified: Secondary | ICD-10-CM

## 2021-01-29 DIAGNOSIS — Z79899 Other long term (current) drug therapy: Secondary | ICD-10-CM

## 2021-01-29 MED ORDER — ROSUVASTATIN CALCIUM 20 MG PO TABS: 20.0000 mg | ORAL_TABLET | Freq: Every day | ORAL | 3 refills | Status: DC

## 2021-01-29 NOTE — Telephone Encounter (Signed)
-----   Message from Fay Records, MD sent at 01/24/2021  4:24 PM EST ----- See forwarded note re Crestor

## 2021-01-29 NOTE — Telephone Encounter (Signed)
Pt verbalized understanding of her lab results... see other encounter.

## 2021-01-29 NOTE — Telephone Encounter (Signed)
Patient notified of result. 

## 2021-01-29 NOTE — Telephone Encounter (Signed)
Patient returning call for lab results. 

## 2021-02-05 DIAGNOSIS — H52209 Unspecified astigmatism, unspecified eye: Secondary | ICD-10-CM | POA: Diagnosis not present

## 2021-02-05 DIAGNOSIS — H524 Presbyopia: Secondary | ICD-10-CM | POA: Diagnosis not present

## 2021-02-05 DIAGNOSIS — H5203 Hypermetropia, bilateral: Secondary | ICD-10-CM | POA: Diagnosis not present

## 2021-02-08 DIAGNOSIS — M25561 Pain in right knee: Secondary | ICD-10-CM | POA: Diagnosis not present

## 2021-02-16 DIAGNOSIS — Z206 Contact with and (suspected) exposure to human immunodeficiency virus [HIV]: Secondary | ICD-10-CM | POA: Diagnosis not present

## 2021-03-20 ENCOUNTER — Encounter: Payer: Self-pay | Admitting: Gastroenterology

## 2021-03-20 ENCOUNTER — Ambulatory Visit (AMBULATORY_SURGERY_CENTER): Payer: Medicare HMO | Admitting: Gastroenterology

## 2021-03-20 VITALS — BP 132/66 | HR 65 | Temp 97.1°F | Resp 21 | Ht 67.0 in | Wt 226.0 lb

## 2021-03-20 DIAGNOSIS — K5730 Diverticulosis of large intestine without perforation or abscess without bleeding: Secondary | ICD-10-CM | POA: Diagnosis not present

## 2021-03-20 DIAGNOSIS — R0989 Other specified symptoms and signs involving the circulatory and respiratory systems: Secondary | ICD-10-CM | POA: Diagnosis not present

## 2021-03-20 DIAGNOSIS — R1310 Dysphagia, unspecified: Secondary | ICD-10-CM | POA: Diagnosis not present

## 2021-03-20 DIAGNOSIS — K648 Other hemorrhoids: Secondary | ICD-10-CM

## 2021-03-20 DIAGNOSIS — K640 First degree hemorrhoids: Secondary | ICD-10-CM | POA: Diagnosis not present

## 2021-03-20 DIAGNOSIS — D508 Other iron deficiency anemias: Secondary | ICD-10-CM

## 2021-03-20 DIAGNOSIS — K222 Esophageal obstruction: Secondary | ICD-10-CM

## 2021-03-20 DIAGNOSIS — D120 Benign neoplasm of cecum: Secondary | ICD-10-CM | POA: Diagnosis not present

## 2021-03-20 DIAGNOSIS — K317 Polyp of stomach and duodenum: Secondary | ICD-10-CM | POA: Diagnosis not present

## 2021-03-20 DIAGNOSIS — D6489 Other specified anemias: Secondary | ICD-10-CM | POA: Diagnosis not present

## 2021-03-20 DIAGNOSIS — K2990 Gastroduodenitis, unspecified, without bleeding: Secondary | ICD-10-CM

## 2021-03-20 DIAGNOSIS — K2950 Unspecified chronic gastritis without bleeding: Secondary | ICD-10-CM | POA: Diagnosis not present

## 2021-03-20 DIAGNOSIS — K3189 Other diseases of stomach and duodenum: Secondary | ICD-10-CM | POA: Diagnosis not present

## 2021-03-20 DIAGNOSIS — D124 Benign neoplasm of descending colon: Secondary | ICD-10-CM

## 2021-03-20 DIAGNOSIS — K219 Gastro-esophageal reflux disease without esophagitis: Secondary | ICD-10-CM

## 2021-03-20 DIAGNOSIS — D509 Iron deficiency anemia, unspecified: Secondary | ICD-10-CM | POA: Diagnosis not present

## 2021-03-20 DIAGNOSIS — K2970 Gastritis, unspecified, without bleeding: Secondary | ICD-10-CM | POA: Diagnosis not present

## 2021-03-20 MED ORDER — SODIUM CHLORIDE 0.9 % IV SOLN: 500.0000 mL | Freq: Once | INTRAVENOUS | Status: DC

## 2021-03-20 NOTE — Progress Notes (Signed)
GASTROENTEROLOGY PROCEDURE H&P NOTE   Primary Care Physician: Janie Morning, DO    Reason for Procedure:  Iron deficiency anemia, epigastric pain, dysphagia, globus sensation, worsening reflux symptoms  Plan:    EGD, colonoscopy  Patient is appropriate for endoscopic procedure(s) in the ambulatory ([place]) setting.  The nature of the procedure, as well as the risks, benefits, and alternatives were carefully and thoroughly reviewed with the patient. Ample time for discussion and questions allowed. The patient understood, was satisfied, and agreed to proceed.     HPI: Shelley Bryant is a 71 y.o. female who presents for EGD and colonoscopy for evaluation of iron deficiency anemia, worsening reflux symptoms globus sensation, dysphagia, and epigastric pain.  Was last seen by Dr. Silverio Decamp on 01/17/2021.  Had recommended changing Protonix to lansoprazole 30 mg bid at that time, but currently taking Protonix 20 mg/day.  Was subsequently seen in the Cardiology Clinic on 01/23/2021 with clearance to proceed with procedures today.   Past Medical History:  Diagnosis Date   Anemia    Diabetes mellitus without complication (HCC)    GERD (gastroesophageal reflux disease)    Hx of colonic polyps    Hypertension     Past Surgical History:  Procedure Laterality Date   HERNIA REPAIR      Prior to Admission medications   Medication Sig Start Date End Date Taking? Authorizing Provider  amLODipine (NORVASC) 5 MG tablet 1 tablet 02/24/17  Yes [provider]  hydrochlorothiazide (HYDRODIURIL) 25 MG tablet Take 1 tablet by mouth daily. 03/29/20  Yes [provider]  metFORMIN (GLUCOPHAGE) 500 MG tablet 1 tablet with a meal 01/03/20  Yes [provider]  pantoprazole (PROTONIX) 20 MG tablet 1 tablet 08/20/17  Yes [provider]  rosuvastatin (CRESTOR) 20 MG tablet Take 1 tablet (20 mg total) by mouth daily. 01/29/21  Yes Fay Records, MD    Current Outpatient  Medications  Medication Sig Dispense Refill   amLODipine (NORVASC) 5 MG tablet 1 tablet     hydrochlorothiazide (HYDRODIURIL) 25 MG tablet Take 1 tablet by mouth daily.     metFORMIN (GLUCOPHAGE) 500 MG tablet 1 tablet with a meal     pantoprazole (PROTONIX) 20 MG tablet 1 tablet     rosuvastatin (CRESTOR) 20 MG tablet Take 1 tablet (20 mg total) by mouth daily. 90 tablet 3   Current Facility-Administered Medications  Medication Dose Route Frequency Provider Last Rate Last Admin   0.9 %  sodium chloride infusion  500 mL Intravenous Once Nicholette Dolson V, DO        Allergies as of 03/20/2021   (No Known Allergies)    Family History  Problem Relation Age of Onset   Heart disease Mother    Cirrhosis Father     Social History   Socioeconomic History   Marital status: Married    Spouse name: Not on file   Number of children: Not on file   Years of education: Not on file   Highest education level: Not on file  Occupational History   Not on file  Tobacco Use   Smoking status: Never   Smokeless tobacco: Never  Substance and Sexual Activity   Alcohol use: Yes   Drug use: Never   Sexual activity: Not on file  Other Topics Concern   Not on file  Social History Narrative   Not on file   Social Determinants of Health   Financial Resource Strain: Not on file  Food Insecurity:  Not on file  Transportation Needs: Not on file  Physical Activity: Not on file  Stress: Not on file  Social Connections: Not on file  Intimate Partner Violence: Not on file    Physical Exam: Vital signs in last 24 hours: @BP  131/68    Pulse 60    Temp (!) 97.1 F (36.2 C)    Ht 5\' 7"  (1.702 m)    Wt 226 lb (102.5 kg)    SpO2 99%    BMI 35.40 kg/m  GEN: NAD EYE: Sclerae anicteric ENT: MMM CV: Non-tachycardic Pulm: CTA b/l GI: Soft, NT/ND NEURO:  Alert & Oriented x 3   Gerrit Heck, DO [facility]   03/20/2021 10:29 AM

## 2021-03-20 NOTE — Patient Instructions (Signed)
Discharge instructions given. Handouts on Gastritis, Dilatation diet,polyps and Hemorrhoids. Resume previous medications. YOU HAD AN ENDOSCOPIC PROCEDURE TODAY AT [facility]:   Refer to the procedure report that was given to you for any specific questions about what was found during the examination.  If the procedure report does not answer your questions, please call your gastroenterologist to clarify.  If you requested that your care partner not be given the details of your procedure findings, then the procedure report has been included in a sealed envelope for you to review at your convenience later.  YOU SHOULD EXPECT: Some feelings of bloating in the abdomen. Passage of more gas than usual.  Walking can help get rid of the air that was put into your GI tract during the procedure and reduce the bloating. If you had a lower endoscopy (such as a colonoscopy or flexible sigmoidoscopy) you may notice spotting of blood in your stool or on the toilet paper. If you underwent a bowel prep for your procedure, you may not have a normal bowel movement for a few days.  Please Note:  You might notice some irritation and congestion in your nose or some drainage.  This is from the oxygen used during your procedure.  There is no need for concern and it should clear up in a day or so.  SYMPTOMS TO REPORT IMMEDIATELY:  Following lower endoscopy (colonoscopy or flexible sigmoidoscopy):  Excessive amounts of blood in the stool  Significant tenderness or worsening of abdominal pains  Swelling of the abdomen that is new, acute  Fever of 100F or higher  Following upper endoscopy (EGD)  Vomiting of blood or coffee ground material  New chest pain or pain under the shoulder blades  Painful or persistently difficult swallowing  New shortness of breath  Fever of 100F or higher  Black, tarry-looking stools  For urgent or emergent issues, a gastroenterologist can be reached at any hour by calling  [phone]. Do not use MyChart messaging for urgent concerns.    DIET:  We do recommend a small meal at first, but then you may proceed to your regular diet.  Drink plenty of fluids but you should avoid alcoholic beverages for 24 hours.  ACTIVITY:  You should plan to take it easy for the rest of today and you should NOT DRIVE or use heavy machinery until tomorrow (because of the sedation medicines used during the test).    FOLLOW UP: Our staff will call the number listed on your records 48-72 hours following your procedure to check on you and address any questions or concerns that you may have regarding the information given to you following your procedure. If we do not reach you, we will leave a message.  We will attempt to reach you two times.  During this call, we will ask if you have developed any symptoms of COVID 19. If you develop any symptoms (ie: fever, flu-like symptoms, shortness of breath, cough etc.) before then, please call [phone].  If you test positive for Covid 19 in the 2 weeks post procedure, please call and report this information to [place].    If any biopsies were taken you will be contacted by phone or by letter within the next 1-3 weeks.  Please call us at [phone] if you have not heard about the biopsies in 3 weeks.    SIGNATURES/CONFIDENTIALITY: You and/or your care partner have signed paperwork which will be entered into your electronic medical record.  These  signatures attest to the fact that that the information above on your After Visit Summary has been reviewed and is understood.  Full responsibility of the confidentiality of this discharge information lies with you and/or your care-partner.

## 2021-03-20 NOTE — Progress Notes (Signed)
A and O x3. Report to RN. Tolerated MAC anesthesia well. Teeth unchanged after procedure.

## 2021-03-20 NOTE — Op Note (Signed)
[facility] Patient Name: Shelley Bryant Procedure Date: 03/20/2021 10:29 AM MRN: [id number] Endoscopist: Gerrit Heck , MD Age: 71 Referring MD:  Date of Birth: 1950-09-16 Gender: Female Account #: [id number] Procedure:                Colonoscopy Indications:              Epigastric abdominal pain, Microcytic anemia with                            reduced iron saturation %                           Last colonoscopy was about 7 years ago in                            [place] with unknown results. Medicines:                Monitored Anesthesia Care Procedure:                Pre-Anesthesia Assessment:                           - Prior to the procedure, a History and Physical                            was performed, and patient medications and                            allergies were reviewed. The patient's tolerance of                            previous anesthesia was also reviewed. The risks                            and benefits of the procedure and the sedation                            options and risks were discussed with the patient.                            All questions were answered, and informed consent                            was obtained. Prior Anticoagulants: The patient has                            taken no previous anticoagulant or antiplatelet                            agents. ASA Grade Assessment: III - A patient with                            severe systemic disease. After reviewing the risks  and benefits, the patient was deemed in                            satisfactory condition to undergo the procedure.                           After obtaining informed consent, the colonoscope                            was passed under direct vision. Throughout the                            procedure, the patient's blood pressure, pulse, and                            oxygen saturations were monitored continuously. The                             CF [id number] #[id number] was introduced through the anus                            and advanced to the the cecum, identified by                            appendiceal orifice and ileocecal valve. The                            colonoscopy was performed without difficulty. The                            patient tolerated the procedure well. The quality                            of the bowel preparation was good. The ileocecal                            valve, appendiceal orifice, and rectum were                            photographed. Scope In: 10:56:57 AM Scope Out: 11:12:32 AM Scope Withdrawal Time: 0 hours 13 minutes 9 seconds  Total Procedure Duration: 0 hours 15 minutes 35 seconds  Findings:                 The perianal and digital rectal examinations were                            normal.                           Three sessile polyps were found in the descending                            colon (2) and cecum. The polyps were 2 to 4 mm in  size. These polyps were removed with a cold snare.                            Resection and retrieval were complete. Estimated                            blood loss was minimal.                           Non-bleeding internal hemorrhoids were found during                            retroflexion. The hemorrhoids were small. Complications:            No immediate complications. Estimated Blood Loss:     Estimated blood loss was minimal. Impression:               - Three 2 to 4 mm polyps in the descending colon                            and in the cecum, removed with a cold snare.                            Resected and retrieved.                           - Non-bleeding internal hemorrhoids. Recommendation:           - Patient has a contact number available for                            emergencies. The signs and symptoms of potential                            delayed complications were discussed with  the                            patient. Return to normal activities tomorrow.                            Written discharge instructions were provided to the                            patient.                           - Resume previous diet.                           - Continue present medications.                           - Await pathology results.                           - Repeat colonoscopy for surveillance based on  pathology results.                           - Follow-up with Dr. Silverio Decamp in the GI clinic at                            appointment to be scheduled. Gerrit Heck, MD 03/20/2021 11:32:43 AM

## 2021-03-20 NOTE — Op Note (Signed)
[facility] Patient Name: Shelley Bryant Procedure Date: 03/20/2021 10:29 AM MRN: [id number] Endoscopist: Gerrit Heck , MD Age: 71 Referring MD:  Date of Birth: 11-26-1950 Gender: Female Account #: [id number] Procedure:                Upper GI endoscopy Indications:              Epigastric abdominal pain, Dysphagia, Suspected                            esophageal reflux, Globus sensation Medicines:                Monitored Anesthesia Care Procedure:                Pre-Anesthesia Assessment:                           - Prior to the procedure, a History and Physical                            was performed, and patient medications and                            allergies were reviewed. The patient's tolerance of                            previous anesthesia was also reviewed. The risks                            and benefits of the procedure and the sedation                            options and risks were discussed with the patient.                            All questions were answered, and informed consent                            was obtained. Prior Anticoagulants: The patient has                            taken no previous anticoagulant or antiplatelet                            agents. ASA Grade Assessment: III - A patient with                            severe systemic disease. After reviewing the risks                            and benefits, the patient was deemed in                            satisfactory condition to undergo the procedure.  After obtaining informed consent, the endoscope was                            passed under direct vision. Throughout the                            procedure, the patient's blood pressure, pulse, and                            oxygen saturations were monitored continuously. The                            GIF HQ190 #[id number] was introduced through the                            mouth, and advanced  to the second part of duodenum.                            The upper GI endoscopy was accomplished without                            difficulty. The patient tolerated the procedure                            well. Scope In: Scope Out: Findings:                 One benign-appearing, intrinsic mild stenosis was                            found 36 cm from the incisors. This stenosis                            measured 1 cm (in length). The stenosis was                            traversed. A TTS dilator was passed through the                            scope. Dilation with an 18-19-20 mm balloon dilator                            was performed to 20 mm. The dilation site was                            examined and showed mild mucosal disruption and                            moderate improvement in luminal narrowing.                            Estimated blood loss was minimal.  The Z-line was regular and was found 37 cm from the                            incisors.                           The upper third of the esophagus and middle third                            of the esophagus were normal.                           Segmental mild inflammation characterized by                            erythema was found in the gastric fundus, in the                            gastric body and in the gastric antrum. Biopsies                            were taken with a cold forceps for Helicobacter                            pylori testing. Estimated blood loss was minimal.                           Multiple small sessile polyps with no bleeding and                            no stigmata of recent bleeding were found in the                            gastric fundus and in the gastric body. Several of                            these polyps were removed with a cold biopsy                            forceps for histologic representative evaluation.                             Resection and retrieval were complete. Estimated                            blood loss was minimal.                           A single 8 mm mucosal nodule with no bleeding was                            found in the gastric antrum. The overlying mucosa  was normal appearing. The lesion was firm and                            semi-mobile with closed forceps. Negative pillow                            sign. Tunneled bite-on-bite biopsies were taken                            with a cold forceps for histology. Estimated blood                            loss was minimal.                           The examined duodenum was normal. Biopsies were                            taken with a cold forceps for histology. Estimated                            blood loss was minimal. Complications:            No immediate complications. Estimated Blood Loss:     Estimated blood loss was minimal. Impression:               - Benign-appearing esophageal stenosis. Dilated                            with 20 mm TTS balloon with appropriate mucosal                            rent.                           - Z-line regular, 37 cm from the incisors.                           - Normal upper third of esophagus and middle third                            of esophagus.                           - Gastritis. Biopsied.                           - Multiple gastric polyps. Resected and retrieved.                           - A single mucosal nodule found in the stomach.                            Biopsied.                           - Normal examined  duodenum. Biopsied. Recommendation:           - Patient has a contact number available for                            emergencies. The signs and symptoms of potential                            delayed complications were discussed with the                            patient. Return to normal activities tomorrow.                            Written  discharge instructions were provided to the                            patient.                           - Soft diet today then advance as tolerated                            tomorrow per post dilation protocol.                           - Continue present medications.                           - Await pathology results.                           - Perform a colonoscopy today.                           - If the biopsies from the gastric nodule are                            unrevealing, can discuss role of referral for                            Endoscopic Ultrasound for futher evaluation vs                            repeat EGD at 1 year for surveillance. Gerrit Heck, MD 03/20/2021 11:27:50 AM

## 2021-03-20 NOTE — Progress Notes (Signed)
Called to room to assist during endoscopic procedure.  Patient ID and intended procedure confirmed with present staff. Received instructions for my participation in the procedure from the performing physician.  

## 2021-03-21 ENCOUNTER — Encounter: Payer: Medicare HMO | Admitting: Gastroenterology

## 2021-03-22 ENCOUNTER — Telehealth: Payer: Self-pay | Admitting: *Deleted

## 2021-03-22 ENCOUNTER — Telehealth: Payer: Self-pay

## 2021-03-22 NOTE — Telephone Encounter (Signed)
No answer for post procedure call back. Left VM. 

## 2021-03-22 NOTE — Telephone Encounter (Signed)
Patient returning your call,  stated she is doing well

## 2021-03-22 NOTE — Telephone Encounter (Signed)
°  Follow up Call-  Call back number 03/20/2021  Post procedure Call Back phone  # [phone]  Permission to leave phone message Yes    1st follow up call made.  NALM

## 2021-03-22 NOTE — Telephone Encounter (Signed)
Per 03/20/21 procedure report - Follow-up with Dr. Silverio Decamp in the GI clinic at appt to be scheduled.   Lm on vm for patient to return call to schedule a follow up appt with Dr. Silverio Decamp.

## 2021-03-23 ENCOUNTER — Encounter: Payer: Self-pay | Admitting: Gastroenterology

## 2021-03-23 NOTE — Telephone Encounter (Signed)
Lm on vm for patient to return call to be scheduled for a follow up with Dr. Silverio Decamp.

## 2021-03-26 NOTE — Telephone Encounter (Signed)
Called and spoke with patient. She has been scheduled for a follow up with Dr. Silverio Decamp on Monday, 04/09/21 at 9:30 am. Pt verbalized understanding and had no concerns at the end of the call.

## 2021-04-02 ENCOUNTER — Other Ambulatory Visit: Payer: Medicare HMO

## 2021-04-02 ENCOUNTER — Other Ambulatory Visit: Payer: Self-pay

## 2021-04-02 DIAGNOSIS — E785 Hyperlipidemia, unspecified: Secondary | ICD-10-CM | POA: Diagnosis not present

## 2021-04-02 DIAGNOSIS — Z79899 Other long term (current) drug therapy: Secondary | ICD-10-CM

## 2021-04-02 LAB — LIPID PANEL
Chol/HDL Ratio: 2 ratio (ref 0.0–4.4)
Cholesterol, Total: 119 mg/dL (ref 100–199)
HDL: 59 mg/dL (ref >39)
LDL Chol Calc (NIH): 42 mg/dL (ref 0–99)
Triglycerides: 93 mg/dL (ref 0–149)
VLDL Cholesterol Cal: 18 mg/dL (ref 5–40)

## 2021-04-02 LAB — AST: AST: 18 IU/L (ref 0–40)

## 2021-04-09 ENCOUNTER — Ambulatory Visit: Payer: Medicare HMO | Admitting: Gastroenterology

## 2021-04-09 ENCOUNTER — Encounter: Payer: Self-pay | Admitting: Gastroenterology

## 2021-04-09 VITALS — BP 122/84 | HR 72 | Ht 67.0 in | Wt 231.1 lb

## 2021-04-09 DIAGNOSIS — K219 Gastro-esophageal reflux disease without esophagitis: Secondary | ICD-10-CM

## 2021-04-09 DIAGNOSIS — K2990 Gastroduodenitis, unspecified, without bleeding: Secondary | ICD-10-CM | POA: Diagnosis not present

## 2021-04-09 DIAGNOSIS — D500 Iron deficiency anemia secondary to blood loss (chronic): Secondary | ICD-10-CM

## 2021-04-09 DIAGNOSIS — Z860100 Personal history of colon polyps, unspecified: Secondary | ICD-10-CM

## 2021-04-09 DIAGNOSIS — K2970 Gastritis, unspecified, without bleeding: Secondary | ICD-10-CM

## 2021-04-09 MED ORDER — PANTOPRAZOLE SODIUM 20 MG PO TBEC: 20.0000 mg | DELAYED_RELEASE_TABLET | Freq: Every day | ORAL | 11 refills | Status: DC

## 2021-04-09 MED ORDER — PANTOPRAZOLE SODIUM 20 MG PO TBEC: 20.0000 mg | DELAYED_RELEASE_TABLET | Freq: Every day | ORAL | 11 refills | Status: AC

## 2021-04-09 NOTE — Patient Instructions (Addendum)
If you are age 72 or older, your body mass index should be between 23-30. Your Body mass index is 36.2 kg/m. If this is out of the aforementioned range listed, please consider follow up with your Primary Care Provider. ________________________________________________________  The [facility] providers would like to encourage you to use [facility] to communicate with providers for non-urgent requests or questions.  Due to long hold times on the telephone, sending your provider a message by [facility] may be a faster and more efficient way to get a response.  Please allow 48 business hours for a response.  Please remember that this is for non-urgent requests.  _______________________________________________________  We have sent the following medications to your pharmacy for you to pick up at your convenience:  Pantoprazole 20mg   You will need a follow up appointment in 1 year.  We will contact you to set this appointment up.  Thank you for entrusting me with your care and choosing [facility].  Dr Silverio Decamp

## 2021-04-09 NOTE — Progress Notes (Signed)
Shelley Bryant    [id number]    [birth year]  Primary Care Physician:Collins, Hinton Dyer, DO  Referring Physician: Janie Morning, [address] [place],  [place] [address]   Chief complaint:  Iron deficiency anemia, GERD  HPI:  71 year old very pleasant female here for follow-up of iron deficiency anemia  Overall she is doing well.  Denies any abdominal pain, dysphagia or odynophagia. No rectal bleeding or melena.  Bowel habits are at baseline.  Colonoscopy 03/20/21 - Three 2 to 4 mm polyps in the descending colon and in the cecum, removed with a cold snare. Resected and retrieved. - Non-bleeding internal hemorrhoids.  EGD 03/20/21 - Benign-appearing esophageal stenosis. Dilated with 20 mm TTS balloon with appropriate mucosal rent. - Z-line regular, 37 cm from the incisors. - Normal upper third of esophagus and middle third of esophagus. - Gastritis. Biopsied. - Multiple gastric polyps. Resected and retrieved. - A single mucosal nodule found in the stomach. Biopsied. - Normal examined duodenum. Biopsied.  1. Surgical [P], duodenal BENIGN DUODENAL MUCOSA WITH NO DIAGNOSTIC ABNORMALITY 2. Surgical [P], gastric polyps MINIMAL CHRONIC FUNDIC GASTRITIS FOCAL EARLY CHANGES OF FUNDIC GLAND POLYP NEGATIVE FOR INTESTINAL METAPLASIA, DYSPLASIA AND CARCINOMA 3. Surgical [P], gastric MILD CHRONIC GASTRITIS WITH REACTIVE EPITHELIAL CHANGES NEGATIVE FOR INTESTINAL METAPLASIA, DYSPLASIA AND CARCINOMA 4. Surgical [P], gastric nodule REACTIVE GASTROPATHY WITH FOVEOLAR HYPERPLASIA COMPATIBLE WITH HYPERPLASTIC POLYP NEGATIVE FOR INTESTINAL METAPLASIA, DYSPLASIA AND CARCINOMA 5. Surgical [P], colon, descending and cecum, polyp (3) TUBULAR ADENOMAS NEGATIVE FOR INTESTINAL METAPLASIA, DYSPLASIA AND CARCINOMA  Outpatient Encounter Medications as of 04/09/2021  Medication Sig   amLODipine (NORVASC) 5 MG tablet Take 5 mg by mouth daily.   hydrochlorothiazide  (HYDRODIURIL) 25 MG tablet Take 1 tablet by mouth daily.   metFORMIN (GLUCOPHAGE) 500 MG tablet Take 500 mg by mouth daily at 2 PM.   pantoprazole (PROTONIX) 20 MG tablet Take 20 mg by mouth daily.   rosuvastatin (CRESTOR) 20 MG tablet Take 1 tablet (20 mg total) by mouth daily.   No facility-administered encounter medications on file as of 04/09/2021.    Allergies as of 04/09/2021 - Review Complete 04/09/2021  Allergen Reaction Noted   Lisinopril Swelling 04/01/2021    Past Medical History:  Diagnosis Date   Anemia    Diabetes mellitus without complication (HCC)    GERD (gastroesophageal reflux disease)    Hx of colonic polyps    Hypertension     Past Surgical History:  Procedure Laterality Date   COLONOSCOPY     HERNIA REPAIR     UPPER GASTROINTESTINAL ENDOSCOPY      Family History  Problem Relation Age of Onset   Heart disease Mother    Cirrhosis Father    Colon cancer Neg Hx    Esophageal cancer Neg Hx    Pancreatic cancer Neg Hx    Stomach cancer Neg Hx     Social History   Socioeconomic History   Marital status: Married    Spouse name: Not on file   Number of children: Not on file   Years of education: Not on file   Highest education level: Not on file  Occupational History   Not on file  Tobacco Use   Smoking status: Former    Types: Cigarettes   Smokeless tobacco: Never  Vaping Use   Vaping Use: Never used  Substance and Sexual Activity   Alcohol use: Yes    Comment: social  Drug use: Never   Sexual activity: Not on file  Other Topics Concern   Not on file  Social History Narrative   Not on file   Social Determinants of Health   Financial Resource Strain: Not on file  Food Insecurity: Not on file  Transportation Needs: Not on file  Physical Activity: Not on file  Stress: Not on file  Social Connections: Not on file  Intimate Partner Violence: Not on file      Review of systems: All other review of systems negative except as  mentioned in the HPI.   Physical Exam: Vitals:   04/09/21 0931  BP: 122/84  Pulse: 72  SpO2: 100%   Body mass index is 36.2 kg/m. Gen:      No acute distress HEENT:  sclera anicteric Abd:      soft, non-tender; no palpable masses, no distension Ext:    No edema Neuro: alert and oriented x 3 Psych: normal mood and affect  Data Reviewed:  Reviewed labs, radiology imaging, old records and pertinent past GI work up   Assessment and Plan/Recommendations:  71 year old very pleasant female here for follow-up for iron deficiency anemia Iron panel has improved, ferritin 225 and iron 48 in November 2022 She has mild borderline low iron saturation Continue multivitamin with oral iron  Continue pantoprazole 20 mg daily and antireflux measures for GERD and gastritis  History of adenomatous colon polyps: Due for recall colonoscopy in January 2028  Return in 1 year or sooner if needed   This visit required 40 minutes of patient care (this includes precharting, chart review, review of results, face-to-face time used for counseling as well as treatment plan and follow-up. The patient was provided an opportunity to ask questions and all were answered. The patient agreed with the plan and demonstrated an understanding of the instructions.  Shelley Bryant , MD    CC: Janie Morning, DO

## 2021-04-26 ENCOUNTER — Encounter: Payer: Self-pay | Admitting: Gastroenterology

## 2021-06-01 DIAGNOSIS — I10 Essential (primary) hypertension: Secondary | ICD-10-CM | POA: Diagnosis not present

## 2021-06-01 DIAGNOSIS — R7303 Prediabetes: Secondary | ICD-10-CM | POA: Diagnosis not present

## 2021-06-01 DIAGNOSIS — Z206 Contact with and (suspected) exposure to human immunodeficiency virus [HIV]: Secondary | ICD-10-CM | POA: Diagnosis not present

## 2021-06-01 DIAGNOSIS — R946 Abnormal results of thyroid function studies: Secondary | ICD-10-CM | POA: Diagnosis not present

## 2021-06-01 DIAGNOSIS — E7800 Pure hypercholesterolemia, unspecified: Secondary | ICD-10-CM | POA: Diagnosis not present

## 2021-06-08 DIAGNOSIS — R7303 Prediabetes: Secondary | ICD-10-CM | POA: Diagnosis not present

## 2021-06-08 DIAGNOSIS — Z0000 Encounter for general adult medical examination without abnormal findings: Secondary | ICD-10-CM | POA: Diagnosis not present

## 2021-06-08 DIAGNOSIS — I129 Hypertensive chronic kidney disease with stage 1 through stage 4 chronic kidney disease, or unspecified chronic kidney disease: Secondary | ICD-10-CM | POA: Diagnosis not present

## 2021-06-08 DIAGNOSIS — E7800 Pure hypercholesterolemia, unspecified: Secondary | ICD-10-CM | POA: Diagnosis not present

## 2021-06-08 DIAGNOSIS — K219 Gastro-esophageal reflux disease without esophagitis: Secondary | ICD-10-CM | POA: Diagnosis not present

## 2021-06-08 DIAGNOSIS — N1831 Chronic kidney disease, stage 3a: Secondary | ICD-10-CM | POA: Diagnosis not present

## 2021-06-08 DIAGNOSIS — M1710 Unilateral primary osteoarthritis, unspecified knee: Secondary | ICD-10-CM | POA: Diagnosis not present

## 2021-08-27 NOTE — Progress Notes (Unsigned)
Cardiology Office Note   Date:  08/29/2021   ID:  Shelley Bryant, Shelley Bryant 28, 1952, MRN [id number]  PCP:  Janie Morning, DO  Cardiologist:   Dorris Carnes, MD   Patient presents for follow-up of chest pain.    History of Present Illness: Shelley Bryant is a 71 y.o. female with a history of  DM, HTN, HL, GERD palpitations and CP.  She was seen in [facility] ED in Aug 2022     Palpitations last seconds.  Tightnes   Felt possible reflux   Not assocaited with activity        I saw the pt in [place] 2022   After I saw the pt she went on to have endoscopy   This showed gastritis, gastric polyps  Also a midesophageal stricture taht was dilated     Since then she says she has been doing good   She denies CP  Breathing is good   No dizziness  No palpitations     Working out at gym Husband still says she snores     Current Meds  Medication Sig   amLODipine (NORVASC) 5 MG tablet Take 5 mg by mouth daily.   hydrochlorothiazide (HYDRODIURIL) 25 MG tablet Take 1 tablet by mouth daily.   metFORMIN (GLUCOPHAGE) 500 MG tablet Take 500 mg by mouth daily at 2 PM.   pantoprazole (PROTONIX) 20 MG tablet Take 1 tablet (20 mg total) by mouth daily.   rosuvastatin (CRESTOR) 20 MG tablet Take 1 tablet (20 mg total) by mouth daily.     Allergies:   Lisinopril   Past Medical History:  Diagnosis Date   Anemia    Diabetes mellitus without complication (HCC)    GERD (gastroesophageal reflux disease)    Hx of colonic polyps    Hypertension     Past Surgical History:  Procedure Laterality Date   COLONOSCOPY     HERNIA REPAIR     UPPER GASTROINTESTINAL ENDOSCOPY       Social History:  The patient  reports that she has quit smoking. Her smoking use included cigarettes. She has never used smokeless tobacco. She reports current alcohol use. She reports that she does not use drugs.   Family History:  The patient's family history includes Cirrhosis in her father; Heart disease in her mother.    ROS:   Please see the history of present illness. All other systems are reviewed and  Negative to the above problem except as noted.    PHYSICAL EXAM: VS:  BP 122/70   Pulse 78   Ht 5' 7.5" (1.715 m)   Wt 227 lb 9.6 oz (103.2 kg)   SpO2 97%   BMI 35.12 kg/m   GEN: Obese 71 yo  in no acute distress  HEENT: normal  Neck: no JVD, carotid bruits   Cardiac: RRR; no murmurs/   No LE  edema  Respiratory:  clear to auscultation bilaterally GI: soft, nontender, nondistended, + BS  No hepatomegaly  MS: no deformity Moving all extremities   Skin: warm and dry, no rash Neuro:  Strength and sensation are intact Psych: euthymic mood, full affect   EKG:  EKG is not ordered    Lipid Panel    Component Value Date/Time   CHOL 119 04/02/2021 0916   TRIG 93 04/02/2021 0916   HDL 59 04/02/2021 0916   CHOLHDL 2.0 04/02/2021 0916   LDLCALC 42 04/02/2021 0916      Wt Readings from Last 3  Encounters:  08/29/21 227 lb 9.6 oz (103.2 kg)  04/09/21 231 lb 2 oz (104.8 kg)  03/20/21 226 lb (102.5 kg)      ASSESSMENT AND PLAN:  1Chest pain   Probably GI in origin   Had EGD with dilation and is on PPI   REsolved   2  HTN  Good control  3  palpitations  Denies    4  HL LDL 43  HDL 59 in Jan 2023     5  DM  A1C is 6.8   Watch/limit carbs        6  ? Sleep apnea Snores  Will reorder  home sleep study.   F/U in 1 year   Current medicines are reviewed at length with the patient today.  The patient does not have concerns regarding medicines.  Signed, Dorris Carnes, MD  08/29/2021 1:00 PM    [address], [place], [place]  [address] Phone: [phone]; Fax: [phone]

## 2021-08-29 ENCOUNTER — Ambulatory Visit: Payer: Medicare HMO | Admitting: Internal Medicine

## 2021-08-29 ENCOUNTER — Encounter: Payer: Self-pay | Admitting: Internal Medicine

## 2021-08-29 VITALS — BP 122/70 | HR 78 | Ht 67.5 in | Wt 227.6 lb

## 2021-08-29 DIAGNOSIS — I10 Essential (primary) hypertension: Secondary | ICD-10-CM | POA: Diagnosis not present

## 2021-08-29 NOTE — Patient Instructions (Signed)
Medication Instructions:   Your physician recommends that you continue on your current medications as directed. Please refer to the Current Medication list given to you today.  *If you need a refill on your cardiac medications before your next appointment, please call your pharmacy*   Lab Work: [place]   If you have labs (blood work) drawn today and your tests are completely normal, you will receive your results only by: [place] (if you have MyChart) OR A paper copy in the mail If you have any lab test that is abnormal or we need to change your treatment, we will call you to review the results.   Testing/Procedures:NONE ORDERED  TODAY     Follow-Up: At [facility], you and your health needs are our priority.  As part of our continuing mission to provide you with exceptional heart care, we have created designated Provider Care Teams.  These Care Teams include your primary Cardiologist (physician) and Advanced Practice Providers (APPs -  Physician Assistants and Nurse Practitioners) who all work together to provide you with the care you need, when you need it.  We recommend signing up for the patient portal called "MyChart".  Sign up information is provided on this After Visit Summary.  MyChart is used to connect with patients for Virtual Visits (Telemedicine).  Patients are able to view lab/test results, encounter notes, upcoming appointments, etc.  Non-urgent messages can be sent to your provider as well.   To learn more about what you can do with MyChart, go to [email or web address].    Your next appointment:   9 month(s)  The format for your next appointment:   In Person  Provider:    Dr. Dorris Carnes {   Other Instructions   Important Information About Sugar

## 2021-09-17 ENCOUNTER — Other Ambulatory Visit: Payer: Self-pay | Admitting: Family Medicine

## 2021-09-17 DIAGNOSIS — Z1231 Encounter for screening mammogram for malignant neoplasm of breast: Secondary | ICD-10-CM

## 2021-09-20 ENCOUNTER — Ambulatory Visit
Admission: RE | Admit: 2021-09-20 | Discharge: 2021-09-20 | Disposition: A | Discharge status: Home / Self Care | Payer: Medicare HMO | Source: Ambulatory Visit

## 2021-09-20 DIAGNOSIS — Z1231 Encounter for screening mammogram for malignant neoplasm of breast: Secondary | ICD-10-CM

## 2021-10-05 ENCOUNTER — Other Ambulatory Visit: Payer: Self-pay | Admitting: Family Medicine

## 2021-10-05 DIAGNOSIS — R928 Other abnormal and inconclusive findings on diagnostic imaging of breast: Secondary | ICD-10-CM

## 2021-10-10 DIAGNOSIS — R220 Localized swelling, mass and lump, head: Secondary | ICD-10-CM | POA: Diagnosis not present

## 2021-10-12 ENCOUNTER — Ambulatory Visit
Admission: RE | Admit: 2021-10-12 | Discharge: 2021-10-12 | Disposition: A | Discharge status: Home / Self Care | Payer: Medicare HMO | Source: Ambulatory Visit | Attending: Family Medicine | Admitting: Family Medicine

## 2021-10-12 ENCOUNTER — Ambulatory Visit: Payer: Medicare HMO

## 2021-10-12 DIAGNOSIS — R928 Other abnormal and inconclusive findings on diagnostic imaging of breast: Secondary | ICD-10-CM | POA: Diagnosis not present

## 2021-10-12 DIAGNOSIS — N6489 Other specified disorders of breast: Secondary | ICD-10-CM | POA: Diagnosis not present

## 2021-10-25 ENCOUNTER — Other Ambulatory Visit: Payer: Self-pay | Admitting: Family Medicine

## 2021-10-25 DIAGNOSIS — R220 Localized swelling, mass and lump, head: Secondary | ICD-10-CM

## 2021-11-09 ENCOUNTER — Other Ambulatory Visit: Payer: Medicare HMO

## 2021-11-09 ENCOUNTER — Ambulatory Visit
Admission: RE | Admit: 2021-11-09 | Discharge: 2021-11-09 | Disposition: A | Discharge status: Home / Self Care | Payer: Medicare HMO | Source: Ambulatory Visit | Attending: Family Medicine | Admitting: Family Medicine

## 2021-11-09 DIAGNOSIS — R220 Localized swelling, mass and lump, head: Secondary | ICD-10-CM

## 2021-12-01 ENCOUNTER — Ambulatory Visit
Admission: RE | Admit: 2021-12-01 | Discharge: 2021-12-01 | Disposition: A | Discharge status: Home / Self Care | Payer: Medicare HMO | Source: Ambulatory Visit | Attending: Family Medicine | Admitting: Family Medicine

## 2021-12-01 ENCOUNTER — Other Ambulatory Visit: Payer: Medicare HMO

## 2021-12-01 DIAGNOSIS — E042 Nontoxic multinodular goiter: Secondary | ICD-10-CM | POA: Diagnosis not present

## 2021-12-01 DIAGNOSIS — R220 Localized swelling, mass and lump, head: Secondary | ICD-10-CM | POA: Diagnosis not present

## 2021-12-01 MED ORDER — GADOBENATE DIMEGLUMINE 529 MG/ML IV SOLN
20.0000 mL | Freq: Once | INTRAVENOUS | Status: AC | PRN
  Administered 2021-12-01: 20 mL via INTRAVENOUS

## 2021-12-12 DIAGNOSIS — N1831 Chronic kidney disease, stage 3a: Secondary | ICD-10-CM | POA: Diagnosis not present

## 2021-12-12 DIAGNOSIS — R7303 Prediabetes: Secondary | ICD-10-CM | POA: Diagnosis not present

## 2021-12-12 DIAGNOSIS — I129 Hypertensive chronic kidney disease with stage 1 through stage 4 chronic kidney disease, or unspecified chronic kidney disease: Secondary | ICD-10-CM | POA: Diagnosis not present

## 2021-12-12 DIAGNOSIS — E7800 Pure hypercholesterolemia, unspecified: Secondary | ICD-10-CM | POA: Diagnosis not present

## 2021-12-14 ENCOUNTER — Other Ambulatory Visit: Payer: Self-pay | Admitting: Family Medicine

## 2021-12-14 DIAGNOSIS — R220 Localized swelling, mass and lump, head: Secondary | ICD-10-CM

## 2021-12-18 ENCOUNTER — Other Ambulatory Visit: Payer: Self-pay | Admitting: Family Medicine

## 2021-12-18 DIAGNOSIS — E041 Nontoxic single thyroid nodule: Secondary | ICD-10-CM

## 2021-12-18 DIAGNOSIS — R9389 Abnormal findings on diagnostic imaging of other specified body structures: Secondary | ICD-10-CM

## 2021-12-19 ENCOUNTER — Ambulatory Visit
Admission: RE | Admit: 2021-12-19 | Discharge: 2021-12-19 | Disposition: A | Discharge status: Home / Self Care | Payer: Medicare HMO | Source: Ambulatory Visit | Attending: Family Medicine | Admitting: Family Medicine

## 2021-12-19 ENCOUNTER — Other Ambulatory Visit: Payer: Medicare HMO

## 2021-12-19 DIAGNOSIS — R9389 Abnormal findings on diagnostic imaging of other specified body structures: Secondary | ICD-10-CM

## 2021-12-19 DIAGNOSIS — R946 Abnormal results of thyroid function studies: Secondary | ICD-10-CM | POA: Diagnosis not present

## 2021-12-19 DIAGNOSIS — N1831 Chronic kidney disease, stage 3a: Secondary | ICD-10-CM | POA: Diagnosis not present

## 2021-12-19 DIAGNOSIS — I129 Hypertensive chronic kidney disease with stage 1 through stage 4 chronic kidney disease, or unspecified chronic kidney disease: Secondary | ICD-10-CM | POA: Diagnosis not present

## 2021-12-19 DIAGNOSIS — E042 Nontoxic multinodular goiter: Secondary | ICD-10-CM | POA: Diagnosis not present

## 2021-12-19 DIAGNOSIS — E041 Nontoxic single thyroid nodule: Secondary | ICD-10-CM

## 2021-12-19 DIAGNOSIS — M1710 Unilateral primary osteoarthritis, unspecified knee: Secondary | ICD-10-CM | POA: Diagnosis not present

## 2021-12-19 DIAGNOSIS — Z23 Encounter for immunization: Secondary | ICD-10-CM | POA: Diagnosis not present

## 2021-12-19 DIAGNOSIS — E1122 Type 2 diabetes mellitus with diabetic chronic kidney disease: Secondary | ICD-10-CM | POA: Diagnosis not present

## 2021-12-19 DIAGNOSIS — K219 Gastro-esophageal reflux disease without esophagitis: Secondary | ICD-10-CM | POA: Diagnosis not present

## 2021-12-31 DIAGNOSIS — E119 Type 2 diabetes mellitus without complications: Secondary | ICD-10-CM | POA: Diagnosis not present

## 2022-04-04 ENCOUNTER — Encounter: Payer: Self-pay | Admitting: Gastroenterology

## 2022-05-20 IMAGING — DX DG CHEST 2V
2 series · 2 of 2 positions shown · non-contrast
Comparison: None.

CLINICAL DATA: Chest pain and shortness of breath.

EXAM:
CHEST - 2 VIEW

[w chest pa]
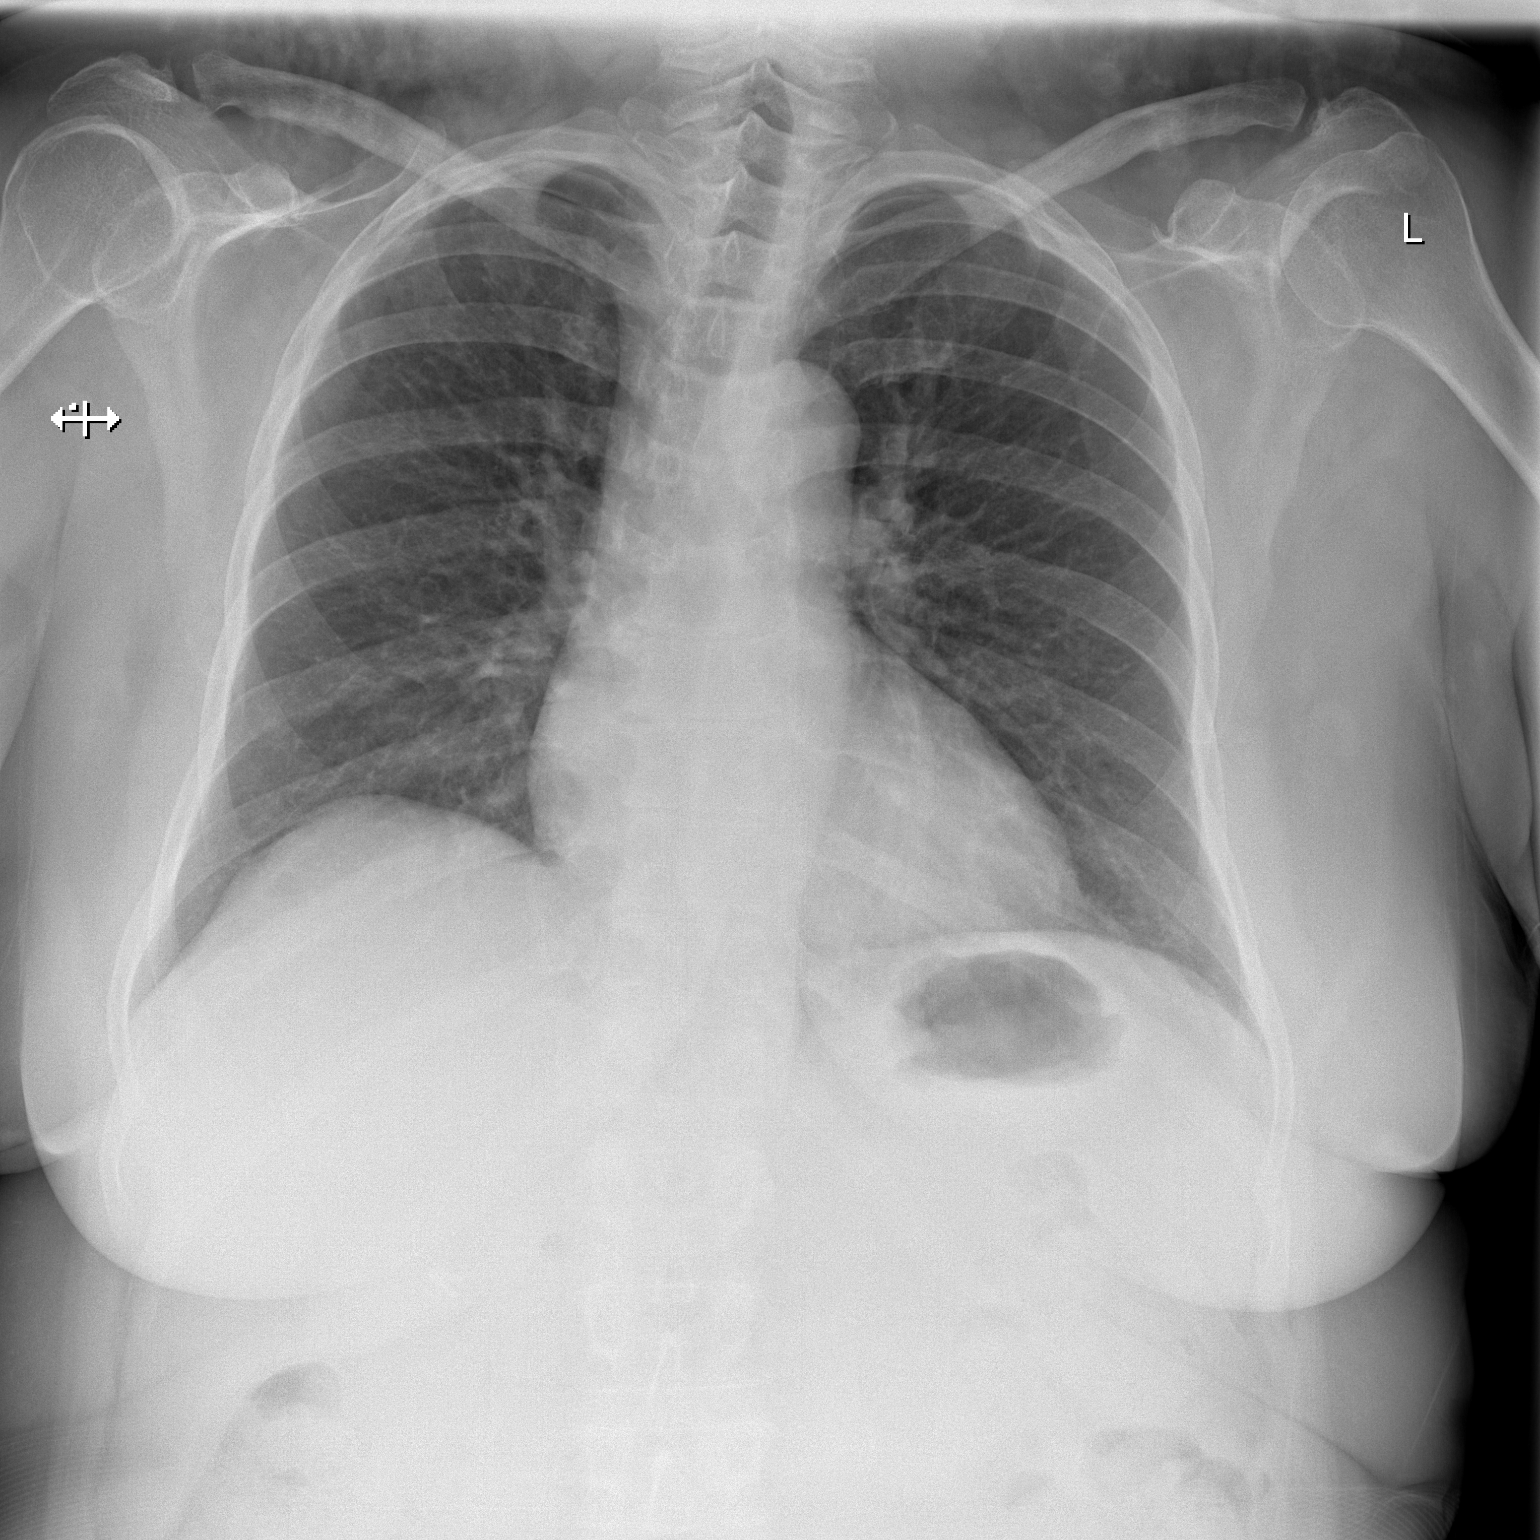

[w chest lat]
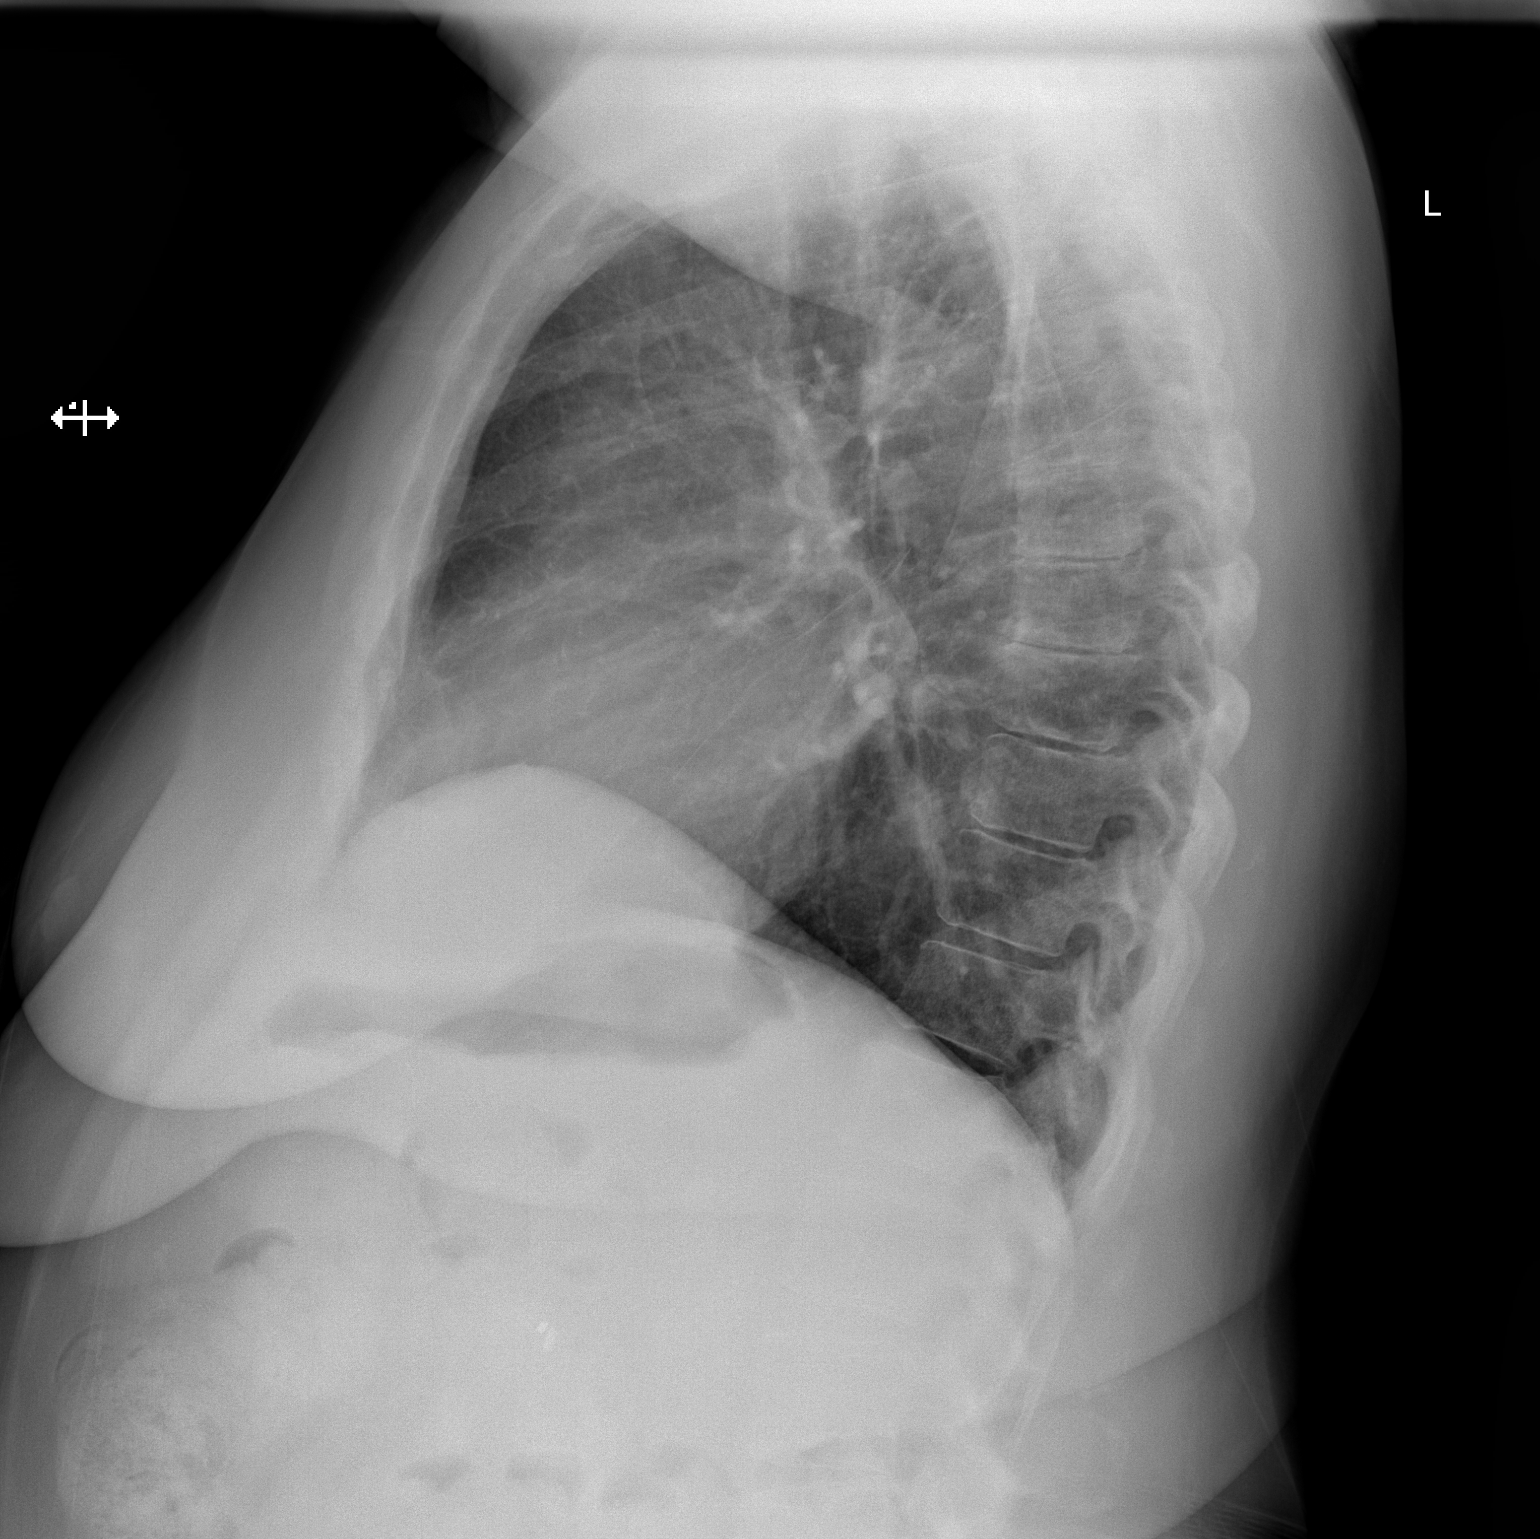

[2 of 2 positions shown; findings below may reference images not displayed]

FINDINGS: Heart size and mediastinal contours are within normal limits. Lungs
are clear. No pleural effusion or pneumothorax is seen. Osseous
structures about the chest are unremarkable.
IMPRESSION: No active cardiopulmonary disease. No evidence of pneumonia or
pulmonary edema.

## 2022-06-05 DIAGNOSIS — K219 Gastro-esophageal reflux disease without esophagitis: Secondary | ICD-10-CM | POA: Diagnosis not present

## 2022-06-05 DIAGNOSIS — N1831 Chronic kidney disease, stage 3a: Secondary | ICD-10-CM | POA: Diagnosis not present

## 2022-06-05 DIAGNOSIS — I129 Hypertensive chronic kidney disease with stage 1 through stage 4 chronic kidney disease, or unspecified chronic kidney disease: Secondary | ICD-10-CM | POA: Diagnosis not present

## 2022-06-05 DIAGNOSIS — Z23 Encounter for immunization: Secondary | ICD-10-CM | POA: Diagnosis not present

## 2022-06-05 DIAGNOSIS — R946 Abnormal results of thyroid function studies: Secondary | ICD-10-CM | POA: Diagnosis not present

## 2022-06-05 DIAGNOSIS — E1122 Type 2 diabetes mellitus with diabetic chronic kidney disease: Secondary | ICD-10-CM | POA: Diagnosis not present

## 2022-06-05 DIAGNOSIS — M1710 Unilateral primary osteoarthritis, unspecified knee: Secondary | ICD-10-CM | POA: Diagnosis not present

## 2022-06-14 DIAGNOSIS — E1122 Type 2 diabetes mellitus with diabetic chronic kidney disease: Secondary | ICD-10-CM | POA: Diagnosis not present

## 2022-06-14 DIAGNOSIS — E7800 Pure hypercholesterolemia, unspecified: Secondary | ICD-10-CM | POA: Diagnosis not present

## 2022-06-14 DIAGNOSIS — Z0000 Encounter for general adult medical examination without abnormal findings: Secondary | ICD-10-CM | POA: Diagnosis not present

## 2022-06-14 DIAGNOSIS — M1710 Unilateral primary osteoarthritis, unspecified knee: Secondary | ICD-10-CM | POA: Diagnosis not present

## 2022-06-14 DIAGNOSIS — I129 Hypertensive chronic kidney disease with stage 1 through stage 4 chronic kidney disease, or unspecified chronic kidney disease: Secondary | ICD-10-CM | POA: Diagnosis not present

## 2022-06-14 DIAGNOSIS — R001 Bradycardia, unspecified: Secondary | ICD-10-CM | POA: Diagnosis not present

## 2022-06-14 DIAGNOSIS — N1831 Chronic kidney disease, stage 3a: Secondary | ICD-10-CM | POA: Diagnosis not present

## 2022-06-25 ENCOUNTER — Encounter: Payer: Self-pay | Admitting: Gastroenterology

## 2022-08-01 DIAGNOSIS — M79606 Pain in leg, unspecified: Secondary | ICD-10-CM | POA: Diagnosis not present

## 2022-10-15 DIAGNOSIS — E7800 Pure hypercholesterolemia, unspecified: Secondary | ICD-10-CM | POA: Diagnosis not present

## 2022-10-15 DIAGNOSIS — I129 Hypertensive chronic kidney disease with stage 1 through stage 4 chronic kidney disease, or unspecified chronic kidney disease: Secondary | ICD-10-CM | POA: Diagnosis not present

## 2022-10-15 DIAGNOSIS — N1831 Chronic kidney disease, stage 3a: Secondary | ICD-10-CM | POA: Diagnosis not present

## 2022-10-15 DIAGNOSIS — E1122 Type 2 diabetes mellitus with diabetic chronic kidney disease: Secondary | ICD-10-CM | POA: Diagnosis not present

## 2022-10-22 DIAGNOSIS — N1831 Chronic kidney disease, stage 3a: Secondary | ICD-10-CM | POA: Diagnosis not present

## 2022-10-22 DIAGNOSIS — E1122 Type 2 diabetes mellitus with diabetic chronic kidney disease: Secondary | ICD-10-CM | POA: Diagnosis not present

## 2022-10-22 DIAGNOSIS — M1710 Unilateral primary osteoarthritis, unspecified knee: Secondary | ICD-10-CM | POA: Diagnosis not present

## 2022-10-22 DIAGNOSIS — E7800 Pure hypercholesterolemia, unspecified: Secondary | ICD-10-CM | POA: Diagnosis not present

## 2022-10-22 DIAGNOSIS — R946 Abnormal results of thyroid function studies: Secondary | ICD-10-CM | POA: Diagnosis not present

## 2022-12-23 DIAGNOSIS — M79606 Pain in leg, unspecified: Secondary | ICD-10-CM | POA: Diagnosis not present

## 2022-12-23 DIAGNOSIS — K146 Glossodynia: Secondary | ICD-10-CM | POA: Diagnosis not present

## 2022-12-23 DIAGNOSIS — Z23 Encounter for immunization: Secondary | ICD-10-CM | POA: Diagnosis not present

## 2022-12-31 ENCOUNTER — Other Ambulatory Visit: Payer: Self-pay | Admitting: Family Medicine

## 2022-12-31 DIAGNOSIS — Z1231 Encounter for screening mammogram for malignant neoplasm of breast: Secondary | ICD-10-CM

## 2023-01-03 DIAGNOSIS — E119 Type 2 diabetes mellitus without complications: Secondary | ICD-10-CM | POA: Diagnosis not present

## 2023-01-24 ENCOUNTER — Ambulatory Visit
Admission: RE | Admit: 2023-01-24 | Discharge: 2023-01-24 | Disposition: A | Discharge status: Home / Self Care | Payer: Medicare HMO | Source: Ambulatory Visit | Attending: Family Medicine | Admitting: Family Medicine

## 2023-01-24 DIAGNOSIS — Z1231 Encounter for screening mammogram for malignant neoplasm of breast: Secondary | ICD-10-CM

## 2023-02-17 DIAGNOSIS — R946 Abnormal results of thyroid function studies: Secondary | ICD-10-CM | POA: Diagnosis not present

## 2023-02-17 DIAGNOSIS — N1831 Chronic kidney disease, stage 3a: Secondary | ICD-10-CM | POA: Diagnosis not present

## 2023-02-17 DIAGNOSIS — E7800 Pure hypercholesterolemia, unspecified: Secondary | ICD-10-CM | POA: Diagnosis not present

## 2023-02-17 DIAGNOSIS — E1122 Type 2 diabetes mellitus with diabetic chronic kidney disease: Secondary | ICD-10-CM | POA: Diagnosis not present

## 2023-02-24 DIAGNOSIS — E1122 Type 2 diabetes mellitus with diabetic chronic kidney disease: Secondary | ICD-10-CM | POA: Diagnosis not present

## 2023-02-24 DIAGNOSIS — N1831 Chronic kidney disease, stage 3a: Secondary | ICD-10-CM | POA: Diagnosis not present

## 2023-02-24 DIAGNOSIS — M1710 Unilateral primary osteoarthritis, unspecified knee: Secondary | ICD-10-CM | POA: Diagnosis not present

## 2023-02-24 DIAGNOSIS — E041 Nontoxic single thyroid nodule: Secondary | ICD-10-CM | POA: Diagnosis not present

## 2023-02-24 DIAGNOSIS — K219 Gastro-esophageal reflux disease without esophagitis: Secondary | ICD-10-CM | POA: Diagnosis not present

## 2023-02-24 DIAGNOSIS — I129 Hypertensive chronic kidney disease with stage 1 through stage 4 chronic kidney disease, or unspecified chronic kidney disease: Secondary | ICD-10-CM | POA: Diagnosis not present

## 2023-02-24 DIAGNOSIS — E7800 Pure hypercholesterolemia, unspecified: Secondary | ICD-10-CM | POA: Diagnosis not present

## 2023-06-16 DIAGNOSIS — I129 Hypertensive chronic kidney disease with stage 1 through stage 4 chronic kidney disease, or unspecified chronic kidney disease: Secondary | ICD-10-CM | POA: Diagnosis not present

## 2023-06-16 DIAGNOSIS — E7800 Pure hypercholesterolemia, unspecified: Secondary | ICD-10-CM | POA: Diagnosis not present

## 2023-06-16 DIAGNOSIS — E1122 Type 2 diabetes mellitus with diabetic chronic kidney disease: Secondary | ICD-10-CM | POA: Diagnosis not present

## 2023-06-16 DIAGNOSIS — N1831 Chronic kidney disease, stage 3a: Secondary | ICD-10-CM | POA: Diagnosis not present

## 2023-06-16 DIAGNOSIS — E041 Nontoxic single thyroid nodule: Secondary | ICD-10-CM | POA: Diagnosis not present

## 2023-06-23 DIAGNOSIS — N1831 Chronic kidney disease, stage 3a: Secondary | ICD-10-CM | POA: Diagnosis not present

## 2023-06-23 DIAGNOSIS — M1710 Unilateral primary osteoarthritis, unspecified knee: Secondary | ICD-10-CM | POA: Diagnosis not present

## 2023-06-23 DIAGNOSIS — E1122 Type 2 diabetes mellitus with diabetic chronic kidney disease: Secondary | ICD-10-CM | POA: Diagnosis not present

## 2023-06-23 DIAGNOSIS — E7800 Pure hypercholesterolemia, unspecified: Secondary | ICD-10-CM | POA: Diagnosis not present

## 2023-06-23 DIAGNOSIS — M5416 Radiculopathy, lumbar region: Secondary | ICD-10-CM | POA: Diagnosis not present

## 2023-06-23 DIAGNOSIS — Z0000 Encounter for general adult medical examination without abnormal findings: Secondary | ICD-10-CM | POA: Diagnosis not present

## 2023-06-23 DIAGNOSIS — I129 Hypertensive chronic kidney disease with stage 1 through stage 4 chronic kidney disease, or unspecified chronic kidney disease: Secondary | ICD-10-CM | POA: Diagnosis not present

## 2023-07-14 ENCOUNTER — Emergency Department (HOSPITAL_COMMUNITY)
Admission: EM | Admit: 2023-07-14 | Discharge: 2023-07-14 | Discharge status: Against Medical Advice | Attending: Emergency Medicine | Admitting: Emergency Medicine

## 2023-07-14 ENCOUNTER — Encounter (HOSPITAL_COMMUNITY): Payer: Self-pay

## 2023-07-14 ENCOUNTER — Ambulatory Visit (HOSPITAL_COMMUNITY): Admission: EM | Admit: 2023-07-14 | Discharge: 2023-07-14 | Disposition: A | Discharge status: Home / Self Care

## 2023-07-14 ENCOUNTER — Encounter (HOSPITAL_COMMUNITY): Payer: Self-pay | Admitting: Emergency Medicine

## 2023-07-14 ENCOUNTER — Emergency Department (HOSPITAL_COMMUNITY)

## 2023-07-14 DIAGNOSIS — R0602 Shortness of breath: Secondary | ICD-10-CM

## 2023-07-14 DIAGNOSIS — Z5321 Procedure and treatment not carried out due to patient leaving prior to being seen by health care provider: Secondary | ICD-10-CM | POA: Diagnosis not present

## 2023-07-14 DIAGNOSIS — R42 Dizziness and giddiness: Secondary | ICD-10-CM | POA: Diagnosis not present

## 2023-07-14 DIAGNOSIS — R0789 Other chest pain: Secondary | ICD-10-CM

## 2023-07-14 DIAGNOSIS — R002 Palpitations: Secondary | ICD-10-CM | POA: Insufficient documentation

## 2023-07-14 LAB — BASIC METABOLIC PANEL WITH GFR
Anion gap: 10 (ref 5–15)
BUN: 16 mg/dL (ref 8–23)
CO2: 25 mmol/L (ref 22–32)
Calcium: 9.6 mg/dL (ref 8.9–10.3)
Chloride: 106 mmol/L (ref 98–111)
Creatinine, Ser: 1.05 mg/dL — ABNORMAL HIGH (ref 0.44–1.00)
GFR, Estimated: 56 mL/min — ABNORMAL LOW (ref >60)
Glucose, Bld: 146 mg/dL — ABNORMAL HIGH (ref 70–99)
Potassium: 3.7 mmol/L (ref 3.5–5.1)
Sodium: 141 mmol/L (ref 135–145)

## 2023-07-14 LAB — CBC
HCT: 34.4 % — ABNORMAL LOW (ref 36.0–46.0)
Hemoglobin: 11.9 g/dL — ABNORMAL LOW (ref 12.0–15.0)
MCH: 25.4 pg — ABNORMAL LOW (ref 26.0–34.0)
MCHC: 34.6 g/dL (ref 30.0–36.0)
MCV: 73.3 fL — ABNORMAL LOW (ref 80.0–100.0)
Platelets: 189 10*3/uL (ref 150–400)
RBC: 4.69 MIL/uL (ref 3.87–5.11)
RDW: 15.2 % (ref 11.5–15.5)
WBC: 6.5 10*3/uL (ref 4.0–10.5)
nRBC: 0 % (ref 0.0–0.2)

## 2023-07-14 LAB — TROPONIN I (HIGH SENSITIVITY): Troponin I (High Sensitivity): 4 ng/L (ref <18)

## 2023-07-14 NOTE — ED Notes (Signed)
 Patient is being discharged from the Urgent Care and sent to the Emergency Department via private vehicle . Per Levora Reas, NP, patient is in need of higher level of care due to SOB and lightheadedness. Patient is aware and verbalizes understanding of plan of care.  Vitals:   07/14/23 1725  BP: (!) 141/83  Pulse: 63  Resp: 16  Temp: 98.4 F (36.9 C)  SpO2: 95%

## 2023-07-14 NOTE — ED Provider Notes (Signed)
 [facility]    CSN: [id number] Arrival date & time: 07/14/23  1633      History   Chief Complaint Chief Complaint  Patient presents with   Shortness of Breath    HPI Shelley Bryant is a 73 y.o. female.   Patient presents with shortness of breath, palpitations, lightheadedness, fatigue, and chest discomfort that she describes as indigestion that began yesterday.  Patient states that normally when she gets indigestion she drinks a little Sprite and then will burp and it is normally relieved by this.  Patient states that she tried this yesterday without any relief.  Patient denies symptoms getting worse since yesterday, but states they have been persistent since they began yesterday.  History of hypertension, prediabetes, and GERD.  Patient states that she takes her medications as prescribed on a regular basis.  Denies passing out, blurred vision, numbness, unilateral weakness, slurred speech, and confusion.  The history is provided by the patient and medical records.  Shortness of Breath   Past Medical History:  Diagnosis Date   Anemia    Diabetes mellitus without complication (HCC)    GERD (gastroesophageal reflux disease)    Hx of colonic polyps    Hypertension     There are no active problems to display for this patient.   Past Surgical History:  Procedure Laterality Date   COLONOSCOPY     HERNIA REPAIR     UPPER GASTROINTESTINAL ENDOSCOPY      OB History   No obstetric history on file.      Home Medications    Prior to Admission medications   Medication Sig Start Date End Date Taking? Authorizing Provider  gabapentin (NEURONTIN) 100 MG capsule Take 100 mg by mouth at bedtime. 08/01/22  Yes [provider]  amLODipine (NORVASC) 5 MG tablet Take 5 mg by mouth daily. 02/24/17   [provider]  hydrochlorothiazide (HYDRODIURIL) 25 MG tablet Take 1 tablet by mouth daily. 03/29/20   [provider]  lovastatin (MEVACOR) 20  MG tablet Take 20 mg by mouth daily.    [provider]  metFORMIN (GLUCOPHAGE) 500 MG tablet Take 500 mg by mouth daily at 2 PM. 01/03/20   [provider]  pantoprazole  (PROTONIX ) 20 MG tablet Take 1 tablet (20 mg total) by mouth daily. 04/09/21   Nandigam, Kavitha V, MD  traMADol (ULTRAM) 50 MG tablet Take 50 mg by mouth every 4 (four) hours as needed.    [provider]    Family History Family History  Problem Relation Age of Onset   Heart disease Mother    Cirrhosis Father    Colon cancer Neg Hx    Esophageal cancer Neg Hx    Pancreatic cancer Neg Hx    Stomach cancer Neg Hx     Social History Social History   Tobacco Use   Smoking status: Former    Types: Cigarettes   Smokeless tobacco: Never  Vaping Use   Vaping status: Never Used  Substance Use Topics   Alcohol use: Yes    Comment: social   Drug use: Never     Allergies   Lisinopril   Review of Systems Review of Systems  Respiratory:  Positive for shortness of breath.    Per HPI  Physical Exam Triage Vital Signs ED Triage Vitals  Encounter Vitals Group     BP 07/14/23 1725 (!) 141/83     Systolic BP Percentile --      Diastolic BP  Percentile --      Pulse Rate 07/14/23 1725 63     Resp 07/14/23 1725 16     Temp 07/14/23 1725 98.4 F (36.9 C)     Temp Source 07/14/23 1725 Oral     SpO2 07/14/23 1725 95 %     Weight 07/14/23 1722 220 lb (99.8 kg)     Height 07/14/23 1722 5\' 7"  (1.702 m)     Head Circumference --      Peak Flow --      Pain Score 07/14/23 1721 0     Pain Loc --      Pain Education --      Exclude from Growth Chart --    No data found.  Updated Vital Signs BP (!) 141/83 (BP Location: Left Arm)   Pulse 63   Temp 98.4 F (36.9 C) (Oral)   Resp 16   Ht 5\' 7"  (1.702 m)   Wt 220 lb (99.8 kg)   SpO2 95%   BMI 34.46 kg/m   Visual Acuity Right Eye Distance:   Left Eye Distance:   Bilateral Distance:    Right Eye Near:   Left Eye Near:     Bilateral Near:     Physical Exam Vitals and nursing note reviewed.  Constitutional:      General: She is awake. She is not in acute distress.    Appearance: Normal appearance. She is well-developed and well-groomed. She is not ill-appearing.  Cardiovascular:     Rate and Rhythm: Normal rate and regular rhythm.  Pulmonary:     Effort: Pulmonary effort is normal.     Breath sounds: Normal breath sounds.  Skin:    General: Skin is warm and dry.  Neurological:     General: No focal deficit present.     Mental Status: She is alert and oriented to person, place, and time. Mental status is at baseline.     GCS: GCS eye subscore is 4. GCS verbal subscore is 5. GCS motor subscore is 6.     Cranial Nerves: Cranial nerves 2-12 are intact.     Sensory: Sensation is intact.     Motor: Motor function is intact.     Coordination: Coordination is intact.     Gait: Gait is intact.  Psychiatric:        Behavior: Behavior is cooperative.      UC Treatments / Results  Labs (all labs ordered are listed, but only abnormal results are displayed) Labs Reviewed - No data to display  EKG   Radiology No results found.  Procedures Procedures (including critical care time)  Medications Ordered in UC Medications - No data to display  Initial Impression / Assessment and Plan / UC Course  I have reviewed the triage vital signs and the nursing notes.  Pertinent labs & imaging results that were available during my care of the patient were reviewed by me and considered in my medical decision making (see chart for details).     Patient is well-appearing.  Vital stable.  Patient is mildly hypertensive at 141/83.  Patient reports she did take her blood pressure medication today as prescribed.  No significant findings upon exam.  Lungs and heart sounds normal.  No neurodeficits noted.  GCS 15.  EKG reveals normal sinus rhythm with nonspecific ST and T wave abnormality without obvious ST elevation,  depression, or acute cardiac findings.  Recommended patient be seen in the ER due to persistent chest discomfort, shortness  of breath, palpitations, and lightheadedness with history of hypertension.  Patient is agreeable to plan at this time.  Patient is stable to arrive to the ER via POV. Final Clinical Impressions(s) / UC Diagnoses   Final diagnoses:  Shortness of breath  Lightheadedness  Chest discomfort     Discharge Instructions      Go to the ER for further evaluation of your symptoms.    ED Prescriptions   None    PDMP not reviewed this encounter.   Levora Reas A, NP 07/14/23 1750

## 2023-07-14 NOTE — ED Triage Notes (Signed)
 Pt sent to the Ed for evaluation of her sob and chest palpations that she has been experiencing the last 2 days

## 2023-07-14 NOTE — Discharge Instructions (Signed)
Go to the ER for further evaluation of your symptoms 

## 2023-07-14 NOTE — ED Triage Notes (Signed)
 Patient here today with c/o SOB, lightheadedness, and weakness X 1 day.

## 2023-12-08 DIAGNOSIS — H25813 Combined forms of age-related cataract, bilateral: Secondary | ICD-10-CM | POA: Diagnosis not present

## 2023-12-08 DIAGNOSIS — E119 Type 2 diabetes mellitus without complications: Secondary | ICD-10-CM | POA: Diagnosis not present

## 2023-12-08 DIAGNOSIS — H04123 Dry eye syndrome of bilateral lacrimal glands: Secondary | ICD-10-CM | POA: Diagnosis not present

## 2023-12-21 DIAGNOSIS — Z7982 Long term (current) use of aspirin: Secondary | ICD-10-CM | POA: Diagnosis not present

## 2023-12-21 DIAGNOSIS — I10 Essential (primary) hypertension: Secondary | ICD-10-CM | POA: Diagnosis not present

## 2023-12-21 DIAGNOSIS — Z8249 Family history of ischemic heart disease and other diseases of the circulatory system: Secondary | ICD-10-CM | POA: Diagnosis not present

## 2023-12-21 DIAGNOSIS — Z809 Family history of malignant neoplasm, unspecified: Secondary | ICD-10-CM | POA: Diagnosis not present

## 2023-12-21 DIAGNOSIS — E1142 Type 2 diabetes mellitus with diabetic polyneuropathy: Secondary | ICD-10-CM | POA: Diagnosis not present

## 2023-12-21 DIAGNOSIS — K08109 Complete loss of teeth, unspecified cause, unspecified class: Secondary | ICD-10-CM | POA: Diagnosis not present

## 2023-12-21 DIAGNOSIS — Z823 Family history of stroke: Secondary | ICD-10-CM | POA: Diagnosis not present

## 2023-12-21 DIAGNOSIS — Z7984 Long term (current) use of oral hypoglycemic drugs: Secondary | ICD-10-CM | POA: Diagnosis not present

## 2023-12-23 DIAGNOSIS — Z23 Encounter for immunization: Secondary | ICD-10-CM | POA: Diagnosis not present

## 2023-12-23 DIAGNOSIS — E559 Vitamin D deficiency, unspecified: Secondary | ICD-10-CM | POA: Diagnosis not present

## 2023-12-23 DIAGNOSIS — I129 Hypertensive chronic kidney disease with stage 1 through stage 4 chronic kidney disease, or unspecified chronic kidney disease: Secondary | ICD-10-CM | POA: Diagnosis not present

## 2023-12-23 DIAGNOSIS — N1831 Chronic kidney disease, stage 3a: Secondary | ICD-10-CM | POA: Diagnosis not present

## 2023-12-23 DIAGNOSIS — K219 Gastro-esophageal reflux disease without esophagitis: Secondary | ICD-10-CM | POA: Diagnosis not present

## 2023-12-23 DIAGNOSIS — E7800 Pure hypercholesterolemia, unspecified: Secondary | ICD-10-CM | POA: Diagnosis not present

## 2023-12-23 DIAGNOSIS — E1122 Type 2 diabetes mellitus with diabetic chronic kidney disease: Secondary | ICD-10-CM | POA: Diagnosis not present

## 2023-12-23 DIAGNOSIS — M1990 Unspecified osteoarthritis, unspecified site: Secondary | ICD-10-CM | POA: Diagnosis not present

## 2024-01-14 ENCOUNTER — Other Ambulatory Visit: Payer: Self-pay | Admitting: Family Medicine

## 2024-01-14 DIAGNOSIS — Z1231 Encounter for screening mammogram for malignant neoplasm of breast: Secondary | ICD-10-CM

## 2024-02-04 ENCOUNTER — Ambulatory Visit
Admission: RE | Admit: 2024-02-04 | Discharge: 2024-02-04 | Disposition: A | Source: Ambulatory Visit | Attending: Family Medicine | Admitting: Family Medicine

## 2024-02-04 DIAGNOSIS — Z1231 Encounter for screening mammogram for malignant neoplasm of breast: Secondary | ICD-10-CM
